# Patient Record
Sex: Female | Born: 1995 | Race: White | Hispanic: No | Marital: Single | Smoking: Former smoker
Health system: Southern US, Community
[De-identification: ages and names within clinical notes are randomized; demographics above are authoritative.]

## PROBLEM LIST (undated history)

## (undated) DIAGNOSIS — Z7289 Other problems related to lifestyle: Secondary | ICD-10-CM

## (undated) DIAGNOSIS — J45909 Unspecified asthma, uncomplicated: Secondary | ICD-10-CM

---
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
## 2016-10-25 ENCOUNTER — Emergency Department
Admission: EM | Admit: 2016-10-25 | Discharge: 2016-10-25 | Disposition: A | Discharge status: Home / Self Care | Payer: Self-pay | Attending: Emergency Medicine | Admitting: Emergency Medicine

## 2016-10-25 DIAGNOSIS — H6982 Other specified disorders of Eustachian tube, left ear: Secondary | ICD-10-CM | POA: Insufficient documentation

## 2016-10-25 DIAGNOSIS — H9202 Otalgia, left ear: Secondary | ICD-10-CM | POA: Insufficient documentation

## 2016-10-25 DIAGNOSIS — R062 Wheezing: Secondary | ICD-10-CM | POA: Insufficient documentation

## 2016-10-25 MED ORDER — ALBUTEROL SULFATE HFA 108 (90 BASE) MCG/ACT IN AERS: 2.0000 | INHALATION_SPRAY | RESPIRATORY_TRACT | 0 refills | Status: DC | PRN

## 2016-10-25 MED ORDER — METHYLPREDNISOLONE 4 MG PO TBPK: ORAL_TABLET | ORAL | 0 refills | Status: DC

## 2016-10-25 NOTE — ED Triage Notes (Signed)
Pt states has had a URI. Pt states since 2300 she has had left ear pain. Pt states has decreased hearing from left ear. Pt took  ibuprofen and benadryl pta.

## 2016-10-25 NOTE — ED Provider Notes (Signed)
[facility] Emergency Department Provider Note   ____________________________________________   First MD Initiated Contact with Patient 10/25/16 [id number]     (approximate)  I have reviewed the triage vital signs and the nursing notes.   HISTORY  Chief Complaint Otalgia    HPI Chelsea Reese is a 21 y.o. female who presents to the ED from home with a chief complaint of left earache. Patient reports she has had URI symptoms for the past week. Has been experiencing left earache since 11 PM. No associated drainage or bloody discharge. Does report muffled hearing. Denies recent air travel or trauma. Take ibuprofen and Benadryl prior to arrival with partial relief of symptoms. Denies associated fever, chills, chest pain, shortness of breath, abdominal pain, nausea, vomiting.   Past Medical History:  Diagnosis Date  . Asthma   . Deliberate self-cutting     There are no active problems to display for this patient.   No past surgical history on file.  Prior to Admission medications   Medication Sig Start Date End Date Taking? Authorizing Provider  albuterol (PROVENTIL HFA;VENTOLIN HFA) 108 (90 Base) MCG/ACT inhaler Inhale 2 puffs into the lungs every 4 (four) hours as needed for wheezing or shortness of breath. 10/25/16   Irean Hong, MD  methylPREDNISolone (MEDROL DOSEPAK) 4 MG TBPK tablet Take as directed 10/25/16   Irean Hong, MD    Allergies Patient has no known allergies.  No family history on file.  Social History Social History  Substance Use Topics  . Smoking status: Not on file  . Smokeless tobacco: Not on file  . Alcohol use Not on file  smoker  Review of Systems  Constitutional: No fever/chills. Eyes: No visual changes. ENT: positive for left earache. No sore throat. Cardiovascular: Denies chest pain. Respiratory: Denies shortness of breath. Gastrointestinal: No abdominal pain.  No nausea, no vomiting.  No diarrhea.  No  constipation. Genitourinary: Negative for dysuria. Musculoskeletal: Negative for back pain. Skin: Negative for rash. Neurological: Negative for headaches, focal weakness or numbness.   ____________________________________________   PHYSICAL EXAM:  VITAL SIGNS: ED Triage Vitals  Enc Vitals Group     BP 10/25/16 0103 126/89     Pulse Rate 10/25/16 0103 69     Resp 10/25/16 0103 16     Temp 10/25/16 0103 98.6 F (37 C)     Temp Source 10/25/16 0103 Oral     SpO2 10/25/16 0103 99 %     Weight 10/25/16 0104 120 lb (54.4 kg)     Height 10/25/16 0104  (1.626 m)     Head Circumference --      Peak Flow --      Pain Score 10/25/16 0103 6     Pain Loc --      Pain Edu? --      Excl. in GC? --     Constitutional: Alert and oriented. Well appearing and in no acute distress. Eyes: Conjunctivae are normal. PERRL. EOMI. Head: Atraumatic. Ears: Right TM within normal limits. Left TM with moderate fluid without perforation. No erythema. Nose: No congestion/rhinnorhea. Mouth/Throat: Mucous membranes are moist.  Oropharynx non-erythematous. Postnasal drip noted. Neck: No stridor.  Supple neck without meningismus. Cardiovascular: Normal rate, regular rhythm. Grossly normal heart sounds.  Good peripheral circulation. Respiratory: Normal respiratory effort.  No retractions. Lungs with scattered wheezing. Gastrointestinal: Soft and nontender. No distention. No abdominal bruits. No CVA tenderness. Musculoskeletal: No lower extremity tenderness nor edema.  No joint  effusions. Neurologic:  Normal speech and language. No gross focal neurologic deficits are appreciated. No gait instability. Skin:  Skin is warm, dry and intact. No rash noted. Psychiatric: Mood and affect are normal. Speech and behavior are normal.  ____________________________________________   LABS (all labs ordered are listed, but only abnormal results are displayed)  Labs Reviewed - No data to  display ____________________________________________  EKG  None ____________________________________________  RADIOLOGY  No results found.  ____________________________________________   PROCEDURES  Procedure(s) performed: None  Procedures  Critical Care performed: No  ____________________________________________   INITIAL IMPRESSION / ASSESSMENT AND PLAN / ED COURSE  As part of my medical decision making, I reviewed the following data within the electronic MEDICAL RECORD NUMBER Nursing notes reviewed and incorporated and Notes from prior ED visits.   21 year old female who presents with left ear pain in the setting of recent URI. Offered DuoNeb treatment for wheezing heard on exam. Patient declines; states this is normal for her. Will prescribe Medrol Dosepak which will help both eustachian tube dysfunction as well as wheezing, albuterol inhaler to use as needed, and patient will follow up closely with her PCP. Strict return precautions given. Patient and friend verbalize understanding and agree with plan of care.      ____________________________________________   FINAL CLINICAL IMPRESSION(S) / ED DIAGNOSES  Final diagnoses:  Otalgia of left ear  Dysfunction of left eustachian tube  Wheezing      NEW MEDICATIONS STARTED DURING THIS VISIT:  New Prescriptions   ALBUTEROL (PROVENTIL HFA;VENTOLIN HFA) 108 (90 BASE) MCG/ACT INHALER    Inhale 2 puffs into the lungs every 4 (four) hours as needed for wheezing or shortness of breath.   METHYLPREDNISOLONE (MEDROL DOSEPAK) 4 MG TBPK TABLET    Take as directed     Note:  This document was prepared using Dragon voice recognition software and may include unintentional dictation errors.    Irean Hong, MD 10/25/16 [phone]

## 2016-10-25 NOTE — Discharge Instructions (Signed)
1. Take Medrol Dosepak as prescribed. 2. Use albuterol inhaler 2 puffs every 4 hours as needed for wheezing. 3. Return to the ER for worsening symptoms, persistent vomiting, difficulty breathing or other concerns.

## 2017-01-09 ENCOUNTER — Encounter: Payer: Self-pay | Admitting: Emergency Medicine

## 2017-01-09 ENCOUNTER — Emergency Department
Admission: EM | Admit: 2017-01-09 | Discharge: 2017-01-10 | Disposition: A | Discharge status: Other Acute Inpatient Hospital | Payer: Self-pay | Attending: Emergency Medicine | Admitting: Emergency Medicine

## 2017-01-09 DIAGNOSIS — Y9389 Activity, other specified: Secondary | ICD-10-CM | POA: Insufficient documentation

## 2017-01-09 DIAGNOSIS — J45909 Unspecified asthma, uncomplicated: Secondary | ICD-10-CM | POA: Insufficient documentation

## 2017-01-09 DIAGNOSIS — Z23 Encounter for immunization: Secondary | ICD-10-CM | POA: Insufficient documentation

## 2017-01-09 DIAGNOSIS — R45851 Suicidal ideations: Secondary | ICD-10-CM

## 2017-01-09 DIAGNOSIS — F332 Major depressive disorder, recurrent severe without psychotic features: Secondary | ICD-10-CM | POA: Diagnosis present

## 2017-01-09 DIAGNOSIS — Y999 Unspecified external cause status: Secondary | ICD-10-CM | POA: Insufficient documentation

## 2017-01-09 DIAGNOSIS — F329 Major depressive disorder, single episode, unspecified: Secondary | ICD-10-CM | POA: Insufficient documentation

## 2017-01-09 DIAGNOSIS — S51812A Laceration without foreign body of left forearm, initial encounter: Secondary | ICD-10-CM | POA: Insufficient documentation

## 2017-01-09 DIAGNOSIS — F17200 Nicotine dependence, unspecified, uncomplicated: Secondary | ICD-10-CM | POA: Diagnosis present

## 2017-01-09 DIAGNOSIS — F17210 Nicotine dependence, cigarettes, uncomplicated: Secondary | ICD-10-CM | POA: Insufficient documentation

## 2017-01-09 DIAGNOSIS — Z915 Personal history of self-harm: Secondary | ICD-10-CM | POA: Insufficient documentation

## 2017-01-09 DIAGNOSIS — S61519A Laceration without foreign body of unspecified wrist, initial encounter: Secondary | ICD-10-CM | POA: Diagnosis present

## 2017-01-09 DIAGNOSIS — Y929 Unspecified place or not applicable: Secondary | ICD-10-CM | POA: Insufficient documentation

## 2017-01-09 DIAGNOSIS — X788XXA Intentional self-harm by other sharp object, initial encounter: Secondary | ICD-10-CM | POA: Insufficient documentation

## 2017-01-09 LAB — COMPREHENSIVE METABOLIC PANEL
ALBUMIN: 4.6 g/dL (ref 3.5–5.0)
ALT: 19 U/L (ref 14–54)
ANION GAP: 11 (ref 5–15)
AST: 24 U/L (ref 15–41)
Alkaline Phosphatase: 46 U/L (ref 38–126)
BILIRUBIN TOTAL: 1 mg/dL (ref 0.3–1.2)
BUN: 6 mg/dL (ref 6–20)
CO2: 23 mmol/L (ref 22–32)
Calcium: 8.8 mg/dL — ABNORMAL LOW (ref 8.9–10.3)
Chloride: 105 mmol/L (ref 101–111)
Creatinine, Ser: 0.58 mg/dL (ref 0.44–1.00)
GFR calc Af Amer: >60 mL/min (ref >60)
GFR calc non Af Amer: >60 mL/min (ref >60)
GLUCOSE: 98 mg/dL (ref 65–99)
POTASSIUM: 3.2 mmol/L — AB (ref 3.5–5.1)
Sodium: 139 mmol/L (ref 135–145)
TOTAL PROTEIN: 7.8 g/dL (ref 6.5–8.1)

## 2017-01-09 LAB — ETHANOL: Alcohol, Ethyl (B): 117 mg/dL — ABNORMAL HIGH (ref <10)

## 2017-01-09 LAB — URINE DRUG SCREEN, QUALITATIVE (ARMC ONLY)
AMPHETAMINES, UR SCREEN: NOT DETECTED
BENZODIAZEPINE, UR SCRN: NOT DETECTED
Barbiturates, Ur Screen: NOT DETECTED
Cannabinoid 50 Ng, Ur ~~LOC~~: POSITIVE — AB
Cocaine Metabolite,Ur ~~LOC~~: NOT DETECTED
MDMA (Ecstasy)Ur Screen: NOT DETECTED
METHADONE SCREEN, URINE: NOT DETECTED
OPIATE, UR SCREEN: NOT DETECTED
Phencyclidine (PCP) Ur S: NOT DETECTED
Tricyclic, Ur Screen: NOT DETECTED

## 2017-01-09 LAB — CBC
HEMATOCRIT: 37.8 % (ref 35.0–47.0)
Hemoglobin: 12.9 g/dL (ref 12.0–16.0)
MCH: 30.1 pg (ref 26.0–34.0)
MCHC: 34.1 g/dL (ref 32.0–36.0)
MCV: 88.5 fL (ref 80.0–100.0)
Platelets: 338 10*3/uL (ref 150–440)
RBC: 4.27 MIL/uL (ref 3.80–5.20)
RDW: 12.6 % (ref 11.5–14.5)
WBC: 10.1 10*3/uL (ref 3.6–11.0)

## 2017-01-09 LAB — PREGNANCY, URINE: PREG TEST UR: NEGATIVE

## 2017-01-09 LAB — SALICYLATE LEVEL: Salicylate Lvl: <7 mg/dL (ref 2.8–30.0)

## 2017-01-09 LAB — ACETAMINOPHEN LEVEL

## 2017-01-09 MED ORDER — TETANUS-DIPHTH-ACELL PERTUSSIS 5-2.5-18.5 LF-MCG/0.5 IM SUSP
0.5000 mL | Freq: Once | INTRAMUSCULAR | Status: AC
  Administered 2017-01-10: 0.5 mL via INTRAMUSCULAR
  Filled 2017-01-09: qty 0.5

## 2017-01-09 NOTE — ED Notes (Signed)
Pt presents post self inflicted laceration with razor blade to left forearm. Pt states she wants to die.

## 2017-01-09 NOTE — ED Notes (Signed)
Pt handed off to Lillingtonashley, rn and taken to room 22 for triage and treatment.

## 2017-01-09 NOTE — ED Provider Notes (Signed)
[facility] Emergency Department Provider Note ____________________________________________   First MD Initiated Contact with Patient 01/09/17 2327     (approximate)  I have reviewed the triage vital signs and the nursing notes.   HISTORY  Chief Complaint Suicidal    HPI Lemoyne Karie Schwalbe Ayotte is a 21 y.o. female with remote prior history of self cutting and asthma who presents with lacerations to the left wrist and forearm, acute onset, self-inflicted, and occurring after patient had a disagreement with her significant other who left her.  Patient states that she did not know how to handle her emotions and caught herself as a result.  Patient states that she realizes it was stupid, and has no further intention of hurting herself and no thoughts of suicide or hurting anyone else.  Patient states that multiple years ago she cut herself, and she also has intermittently taken her mother's Zoloft but is not prescribed for an antidepressant herself and is not seen a therapist in years.  She denies any other acute medical issues.  Past Medical History:  Diagnosis Date  . Asthma   . Deliberate self-cutting     There are no active problems to display for this patient.   History reviewed. No pertinent surgical history.  Prior to Admission medications   Medication Sig Start Date End Date Taking? Authorizing Provider  albuterol (PROVENTIL HFA;VENTOLIN HFA) 108 (90 Base) MCG/ACT inhaler Inhale 2 puffs into the lungs every 4 (four) hours as needed for wheezing or shortness of breath. Patient not taking: Reported on 01/10/2017 10/25/16   Irean Hong, MD  methylPREDNISolone (MEDROL DOSEPAK) 4 MG TBPK tablet Take as directed Patient not taking: Reported on 01/10/2017 10/25/16   Irean Hong, MD    Allergies Patient has no known allergies.  No family history on file.  Social History Social History   Tobacco Use  . Smoking status: Current Some Day Smoker    Types:  Cigarettes  . Smokeless tobacco: Never Used  Substance Use Topics  . Alcohol use: Yes    Comment: Pt reports half bottle of liquor every other day  . Drug use: Yes    Types: Marijuana    Review of Systems  Constitutional: No fever. Eyes: No redness. ENT: No sore throat. Cardiovascular: Denies chest pain. Respiratory: Denies shortness of breath. Gastrointestinal: No nausea, no vomiting.  Genitourinary: Negative for flank pain.  Musculoskeletal: Negative for back pain. Skin: Positive for lacerations. Neurological: Negative for headache.   ____________________________________________   PHYSICAL EXAM:  VITAL SIGNS: ED Triage Vitals  Enc Vitals Group     BP --      Pulse Rate 01/09/17 2245 76     Resp 01/09/17 2245 19     Temp 01/09/17 2245 98.8 F (37.1 C)     Temp Source 01/09/17 2245 Oral     SpO2 01/09/17 2245 99 %     Weight 01/09/17 2311 120 lb (54.4 kg)     Height 01/09/17 2311 5\' 3"  (1.6 m)     Head Circumference --      Peak Flow --      Pain Score --      Pain Loc --      Pain Edu? --      Excl. in GC? --     Constitutional: Alert and oriented. Well appearing and in no acute distress. Eyes: Conjunctivae are normal.  Head: Atraumatic. Nose: No congestion/rhinnorhea. Mouth/Throat: Mucous membranes are moist.   Neck: Normal range  of motion.  Cardiovascular:  Good peripheral circulation. Respiratory: Normal respiratory effort.   Gastrointestinal: No distention.  Musculoskeletal:   Extremities warm and well perfused.  Neurologic:  Normal speech and language. No gross focal neurologic deficits are appreciated.  Skin:  Skin is warm and dry. No rash noted. Multiple superficial lacerations to left forearm ranging from 2-5 cm in length. Psychiatric: Mood and affect are normal. Speech and behavior are normal.  ____________________________________________   LABS (all labs ordered are listed, but only abnormal results are displayed)  Labs Reviewed    COMPREHENSIVE METABOLIC PANEL - Abnormal; Notable for the following components:      Result Value   Potassium 3.2 (*)    Calcium 8.8 (*)    All other components within normal limits  ETHANOL - Abnormal; Notable for the following components:   Alcohol, Ethyl (B) 117 (*)    All other components within normal limits  ACETAMINOPHEN LEVEL - Abnormal; Notable for the following components:   Acetaminophen (Tylenol), Serum <10 (*)    All other components within normal limits  URINE DRUG SCREEN, QUALITATIVE ([facility] ONLY) - Abnormal; Notable for the following components:   Cannabinoid 50 Ng, Ur [place] POSITIVE (*)    All other components within normal limits  SALICYLATE LEVEL  CBC  PREGNANCY, URINE   ____________________________________________  EKG   ____________________________________________  RADIOLOGY    ____________________________________________   PROCEDURES  Procedure(s) performed: Yes  LACERATION REPAIR Performed by: Dionne BucySebastian Shemica Meath Authorized by: Dionne BucySebastian Jackey Housey Consent: Verbal consent obtained. Risks and benefits: risks, benefits and alternatives were discussed Consent given by: patient Patient identity confirmed: provided demographic data Prepped and Draped in normal sterile fashion Wound explored  Laceration Location: Left forearm  Laceration Length: Total cumulative length approximately 30 cm  No Foreign Bodies seen or palpated  Anesthesia: local infiltration  Local anesthetic: lidocaine 1% % w/o epinephrine  Anesthetic total: 15 ml  Irrigation method: syringe Amount of cleaning: standard  Skin closure: 4-0 Ethilon  Number of sutures: 18 total sutures across the multiple lacerations to the left forearm.  Technique: Simple interrupted  Patient tolerance: Patient tolerated the procedure well with no immediate complications.    Critical Care performed: No ____________________________________________   INITIAL IMPRESSION / ASSESSMENT AND  PLAN / ED COURSE  Pertinent labs & imaging results that were available during my care of the patient were reviewed by me and considered in my medical decision making (see chart for details).  21 year old female with past history of self cutting behavior presents with an episode of cutting to her left forearm after her significant other left her and they had a verbal altercation.  Patient states that she drank 3 shots of liquor, but denies drug use.  Review of past medical records in epic is noncontributory.  On exam, the patient has multiple superficial lacerations of which will need suture repair to the left forearm.  No other injuries.  At this time, patient is here voluntarily.  She denies any further intention to harm herself, and says that she thinks it was stupid.  She denies any SI or HI or any other acute psychiatric symptoms.  We will have SOC evaluation and patient agrees to stay.  I will not place patient under involuntary commitment at this time as there is no evidence of acute danger to self or others.    ----------------------------------------- 4:10 AM on 01/10/2017 -----------------------------------------  The patient was evaluated by [facility], and the provider recommends inpatient admission.  The patient continues to be  voluntary.  She is medically cleared[facility] and [facility]  Lacerations were repaired.  ____________________________________________   FINAL CLINICAL IMPRESSION(S) / ED DIAGNOSES  Final diagnoses:  Suicidal ideations      NEW MEDICATIONS STARTED DURING THIS VISIT:  This SmartLink is deprecated. Use AVSMEDLIST instead to display the medication list for a patient.   Note:  This document was prepared using Dragon voice recognition software and may include unintentional dictation errors.    Dionne BucySiadecki, Rickelle Sylvestre, MD 01/10/17 [phone]

## 2017-01-09 NOTE — ED Notes (Signed)
Pt's significant other states she has pt's car and car is at sister's house, [facility] [employer] at [phone].

## 2017-01-09 NOTE — ED Triage Notes (Signed)
Pt in via POV; pt reports, "my girlfriend of 4 years found somebody else and I just didn't know how to handle my emotions at this time."  Pt reports cutting herself with razor blade; pt with multiple laceration to left forearm, bleeding controlled at this time, dry bandage placed. Pt tearful during triage, states, "I just want to go home."

## 2017-01-10 ENCOUNTER — Encounter: Payer: Self-pay | Admitting: Psychiatry

## 2017-01-10 ENCOUNTER — Inpatient Hospital Stay
Admission: AD | Admit: 2017-01-10 | Discharge: 2017-01-12 | DRG: 885 | Disposition: A | Discharge status: Home / Self Care | Payer: No Typology Code available for payment source | Source: Intra-hospital | Attending: Psychiatry | Admitting: Psychiatry

## 2017-01-10 ENCOUNTER — Other Ambulatory Visit: Payer: Self-pay

## 2017-01-10 DIAGNOSIS — F17200 Nicotine dependence, unspecified, uncomplicated: Secondary | ICD-10-CM | POA: Diagnosis present

## 2017-01-10 DIAGNOSIS — F332 Major depressive disorder, recurrent severe without psychotic features: Secondary | ICD-10-CM | POA: Diagnosis present

## 2017-01-10 DIAGNOSIS — F1010 Alcohol abuse, uncomplicated: Secondary | ICD-10-CM | POA: Diagnosis present

## 2017-01-10 DIAGNOSIS — Z915 Personal history of self-harm: Secondary | ICD-10-CM | POA: Diagnosis not present

## 2017-01-10 DIAGNOSIS — J45909 Unspecified asthma, uncomplicated: Secondary | ICD-10-CM | POA: Diagnosis present

## 2017-01-10 DIAGNOSIS — Z818 Family history of other mental and behavioral disorders: Secondary | ICD-10-CM | POA: Diagnosis not present

## 2017-01-10 DIAGNOSIS — G4700 Insomnia, unspecified: Secondary | ICD-10-CM | POA: Diagnosis present

## 2017-01-10 DIAGNOSIS — F17210 Nicotine dependence, cigarettes, uncomplicated: Secondary | ICD-10-CM | POA: Diagnosis present

## 2017-01-10 DIAGNOSIS — F1220 Cannabis dependence, uncomplicated: Secondary | ICD-10-CM | POA: Diagnosis present

## 2017-01-10 DIAGNOSIS — X789XXA Intentional self-harm by unspecified sharp object, initial encounter: Secondary | ICD-10-CM | POA: Diagnosis present

## 2017-01-10 DIAGNOSIS — S61519A Laceration without foreign body of unspecified wrist, initial encounter: Secondary | ICD-10-CM | POA: Diagnosis present

## 2017-01-10 DIAGNOSIS — Z79899 Other long term (current) drug therapy: Secondary | ICD-10-CM

## 2017-01-10 DIAGNOSIS — R45851 Suicidal ideations: Secondary | ICD-10-CM | POA: Diagnosis present

## 2017-01-10 MED ORDER — HYDROXYZINE HCL 25 MG PO TABS
25.0000 mg | ORAL_TABLET | Freq: Three times a day (TID) | ORAL | Status: DC | PRN
Start: 2017-01-10 — End: 2017-01-12
  Administered 2017-01-11: 25 mg via ORAL
  Filled 2017-01-10: qty 1

## 2017-01-10 MED ORDER — TRAZODONE HCL 100 MG PO TABS
100.0000 mg | ORAL_TABLET | Freq: Every day | ORAL | Status: DC
  Administered 2017-01-11: 100 mg via ORAL
  Filled 2017-01-10: qty 1

## 2017-01-10 MED ORDER — ACETAMINOPHEN 325 MG PO TABS: 650.0000 mg | ORAL_TABLET | Freq: Four times a day (QID) | ORAL | Status: DC | PRN

## 2017-01-10 MED ORDER — ALUM & MAG HYDROXIDE-SIMETH 200-200-20 MG/5ML PO SUSP: 30.0000 mL | ORAL | Status: DC | PRN

## 2017-01-10 MED ORDER — MAGNESIUM HYDROXIDE 400 MG/5ML PO SUSP: 30.0000 mL | Freq: Every day | ORAL | Status: DC | PRN

## 2017-01-10 MED ORDER — LORAZEPAM 1 MG PO TABS
1.0000 mg | ORAL_TABLET | Freq: Four times a day (QID) | ORAL | Status: DC | PRN
  Administered 2017-01-10 (×2): 1 mg via ORAL
  Filled 2017-01-10 (×2): qty 1

## 2017-01-10 MED ORDER — DOUBLE ANTIBIOTIC 500-10000 UNIT/GM EX OINT
TOPICAL_OINTMENT | Freq: Every day | CUTANEOUS | Status: DC
  Administered 2017-01-11 – 2017-01-12 (×2): via TOPICAL
  Filled 2017-01-10: qty 28.4

## 2017-01-10 MED ORDER — ALBUTEROL SULFATE HFA 108 (90 BASE) MCG/ACT IN AERS
2.0000 | INHALATION_SPRAY | RESPIRATORY_TRACT | Status: DC | PRN
  Filled 2017-01-10: qty 6.7

## 2017-01-10 MED ORDER — GABAPENTIN 100 MG PO CAPS
100.0000 mg | ORAL_CAPSULE | Freq: Three times a day (TID) | ORAL | Status: DC
  Administered 2017-01-11 – 2017-01-12 (×3): 100 mg via ORAL
  Filled 2017-01-10 (×4): qty 1

## 2017-01-10 MED ORDER — NICOTINE 21 MG/24HR TD PT24
21.0000 mg | MEDICATED_PATCH | Freq: Every day | TRANSDERMAL | Status: DC
  Administered 2017-01-11: 21 mg via TRANSDERMAL
  Filled 2017-01-10 (×2): qty 1

## 2017-01-10 MED ORDER — DOUBLE ANTIBIOTIC 500-10000 UNIT/GM EX OINT
TOPICAL_OINTMENT | Freq: Every day | CUTANEOUS | Status: DC
  Filled 2017-01-10 (×5): qty 1

## 2017-01-10 NOTE — BH Assessment (Signed)
Assessment Note  Chelsea Reese is an 21 y.o. female, with a history of cutting, presenting to the ED  with self-inflicted lacerations to the  wrist and forearm, after patient had a disagreement with her significant other who left her. Pt required multiple stitches to her forearm.  Pt reports becoming emotional after her girlfriend left her and says she did not know what else to do.  She currently denies SI/HI while in the ED and states that she feels embarrassed for cutting herself.  Patient admits to drinking alcohol and smoking cannabis.  BAC was 117 and UDS positive for cannabis.  Pt denies any previous psychiatric hospitalizations or mental health treatment.    Patient was evaluated by psychiatry and recommended for inpatient hospitalization for stabilization.    Diagnosis: Major Depressive Disorder  Past Medical History:  Past Medical History:  Diagnosis Date  . Asthma   . Deliberate self-cutting     History reviewed. No pertinent surgical history.  Family History: No family history on file.  Social History:  reports that she has been smoking cigarettes.  she has never used smokeless tobacco. She reports that she drinks alcohol. She reports that she uses drugs. Drug: Marijuana.  Additional Social History:  Alcohol / Drug Use Pain Medications: See PTA Prescriptions: See PTA Over the Counter: See PTA History of alcohol / drug use?: Yes Negative Consequences of Use: Personal relationships Substance #1 Name of Substance 1: ETOH Substance #2 Name of Substance 2: Cannabis  CIWA: CIWA-Ar Pulse Rate: 76 COWS:    Allergies: No Known Allergies  Home Medications:  (Not in a hospital admission)  OB/GYN Status:  Patient's last menstrual period was 01/09/2017 (approximate).  General Assessment Data [facility] Assessment: In system Is this a Tele or Face-to-Face Assessment?: Face-to-Face Is this an Initial Assessment or a Re-assessment for this  encounter?: Initial Assessment Marital status: Single Maiden name: na Is patient pregnant?: No Pregnancy Status: No Living Arrangements: Parent Can pt return to current living arrangement?: Yes Admission Status: Voluntary Is patient capable of signing voluntary admission?: Yes Referral Source: Self/Family/Friend Insurance type: None     Crisis Care Plan Living Arrangements: Parent Legal Guardian: Other:(self) Name of Psychiatrist: none reported Name of Therapist: none reported  Education Status Is patient currently in school?: No Current Grade: na Highest grade of school patient has completed: hs Name of school: na Contact person: na  Risk to self with the past 6 months Suicidal Ideation: Yes-Currently Present Has patient been a risk to self within the past 6 months prior to admission? : No Suicidal Intent: Yes-Currently Present Has patient had any suicidal intent within the past 6 months prior to admission? : No Is patient at risk for suicide?: Yes Suicidal Plan?: Yes-Currently Present Has patient had any suicidal plan within the past 6 months prior to admission? : No Specify Current Suicidal Plan: Pt cut her wrists with razor Access to Means: Yes Specify Access to Suicidal Means: Pt has access to razor What has been your use of drugs/alcohol within the last 12 months?: ETOH/cannabis Previous Attempts/Gestures: Yes How many times?: 5 Other Self Harm Risks: none identified Triggers for Past Attempts: Other personal contacts Intentional Self Injurious Behavior: Cutting Comment - Self Injurious Behavior: Pt has hx of superficial cuts to forearm Family Suicide History: No Recent stressful life event(s): Conflict (Comment)(relationship issues) Persecutory voices/beliefs?: No Depression: Yes Depression Symptoms: Loss of interest in usual pleasures, Feeling worthless/self pity, Tearfulness Substance abuse history and/or treatment  for substance abuse?: Yes Suicide  prevention information given to non-admitted patients: Not applicable  Risk to Others within the past 6 months Homicidal Ideation: No Does patient have any lifetime risk of violence toward others beyond the six months prior to admission? : No Thoughts of Harm to Others: No Current Homicidal Intent: No Current Homicidal Plan: No Access to Homicidal Means: No History of harm to others?: No Assessment of Violence: None Noted Does patient have access to weapons?: No Criminal Charges Pending?: No Does patient have a court date: No Is patient on probation?: No  Psychosis Hallucinations: None noted Delusions: None noted  Mental Status Report Appearance/Hygiene: In scrubs Eye Contact: Fair Motor Activity: Freedom of movement Speech: Logical/coherent Level of Consciousness: Crying Mood: Depressed, Sad Affect: Appropriate to circumstance, Sad, Depressed Anxiety Level: Minimal Thought Processes: Relevant Judgement: Impaired Orientation: Person, Place, Time, Situation Obsessive Compulsive Thoughts/Behaviors: None  Cognitive Functioning Concentration: Normal Memory: Recent Intact, Remote Intact IQ: Average Insight: Poor Impulse Control: Poor Appetite: Fair Sleep: Decreased Total Hours of Sleep: 5 Vegetative Symptoms: None  ADLScreening [facility] Assessment Services) Patient's cognitive ability adequate to safely complete daily activities?: Yes Patient able to express need for assistance with ADLs?: Yes Independently performs ADLs?: Yes (appropriate for developmental age)  Prior Inpatient Therapy Prior Inpatient Therapy: No Prior Therapy Dates: na Prior Therapy Facilty/Provider(s): na Reason for Treatment: na  Prior Outpatient Therapy Prior Outpatient Therapy: No Prior Therapy Dates: na Prior Therapy Facilty/Provider(s): na Reason for Treatment: na Does patient have an ACCT team?: No Does patient have Intensive In-House Services?  : No Does patient have [facility] services? :  No Does patient have P4CC services?: No  ADL Screening (condition at time of admission) Patient's cognitive ability adequate to safely complete daily activities?: Yes Patient able to express need for assistance with ADLs?: Yes Independently performs ADLs?: Yes (appropriate for developmental age)       Abuse/Neglect Assessment (Assessment to be complete while patient is alone) Abuse/Neglect Assessment Can Be Completed: Yes Physical Abuse: Denies Verbal Abuse: Denies Sexual Abuse: Denies Exploitation of patient/patient's resources: Denies Self-Neglect: Denies Values / Beliefs Cultural Requests During Hospitalization: None Spiritual Requests During Hospitalization: None Consults Spiritual Care Consult Needed: No Social Work Consult Needed: No Merchant navy officerAdvance Directives (For Healthcare) Does Patient Have a Medical Advance Directive?: No Would patient like information on creating a medical advance directive?: No - Patient declined    Additional Information 1:1 In Past 12 Months?: No CIRT Risk: No Elopement Risk: No Does patient have medical clearance?: Yes     Disposition:  Disposition Initial Assessment Completed for this Encounter: Yes Disposition of Patient: Inpatient treatment program  On Site Evaluation by:   Reviewed with Physician:    Artist Beachoxana C Aparna Vanderweele 01/10/2017 6:41 AM

## 2017-01-10 NOTE — ED Notes (Signed)

## 2017-01-10 NOTE — Tx Team (Signed)
Initial Treatment Plan 01/10/2017 11:49 PM Chelsea Reese [id number]    PATIENT STRESSORS: Marital or family conflict Substance abuse Traumatic event   PATIENT STRENGTHS: Ability for insight Active sense of humor Average or above average intelligence Capable of independent living Communication skills Financial means General fund of knowledge Motivation for treatment/growth Physical Health Special hobby/interest Supportive family/friends Work skills   PATIENT IDENTIFIED PROBLEMS: Depression 01-10-17  Anxiety 01-10-17  Substance Abuse 01-10-17                 DISCHARGE CRITERIA:  Ability to meet basic life and health needs Adequate post-discharge living arrangements Improved stabilization in mood, thinking, and/or behavior Need for constant or close observation no longer present Reduction of life-threatening or endangering symptoms to within safe limits Safe-care adequate arrangements made Verbal commitment to aftercare and medication compliance  PRELIMINARY DISCHARGE PLAN: Outpatient therapy Participate in family therapy Return to previous living arrangement Return to previous work or school arrangements  PATIENT/FAMILY INVOLVEMENT: This treatment plan has been presented to and reviewed with the patient, Chelsea Reese.  The patient has been given the opportunity to ask questions and make suggestions.  Lenox Pondserry J Tanika Bracco, RN 01/10/2017, 11:49 PM

## 2017-01-10 NOTE — ED Notes (Signed)
Report called to Ceasar MonsMargaret RN, pt moved to ED BHU 2 pending admission per MD orders.

## 2017-01-10 NOTE — ED Notes (Signed)
SOC set up at bedside. 

## 2017-01-10 NOTE — ED Notes (Signed)

## 2017-01-10 NOTE — ED Notes (Signed)
A telephone call was received from patient's mother Chelsea Reese.

## 2017-01-10 NOTE — Progress Notes (Signed)
Pt presents with flat affect and depressed mood this shift. Observed to be tearful at intervals during shift. Endorsed anxiety 8/10 when assessed. Per pt "I feel stupid doing this, I have not cut in about 6 years and now I did this because I was drunk". Denies SI, HI, AVH and pain. Pt states she takes Zoloft 100 mg PO daily at home. When asked who her provider was and when medication was ordered, pt stated "I get it from my mom, she's been treating me for 6 months, it helped and she's the only one who can help me". PRN Ativan given as per order for anxiety and pt verbalized relief when reassessed. Tolerates all PO intake well. Emotional support and availability provided. Pt informed of bed assignment on BMU and is in agreement. Q 15 minutes safety checks maintained without self harm gestures or outburst to report at this time.

## 2017-01-10 NOTE — ED Notes (Signed)
Report was received Chelsea D., RN; Pt. Verbalizes  complaints of having Depression with self cutting; and distress of current relationship; verbalizes having S.I.; and denies having Hi. Continue to monitor with 15 min. Monitoring.

## 2017-01-10 NOTE — BH Assessment (Signed)
[facility] by Dr. Jennet MaduroPucilowska.  Attending Physician will be Dr. Jennet MaduroPucilowska.   Patient has been assigned to room 316A, [facility] Gwen.   Intake Paper Work has been signed and placed on patient chart.  ER staff is aware of the admission [facility]( [facility].; Dr. Roxan Hockeyobinson, ER MD; Amy Patient's Nurse & Marylene LandAngela Patient Access).

## 2017-01-10 NOTE — ED Notes (Signed)
Report was received from Lissa Hoardlivette W., RN; Pt. Verbalizes  complaints of having Depression; with distress of relationship; verbalizes having S.I.; denies having Hi. Continue to monitor with 15 min. Monitoring.

## 2017-01-11 DIAGNOSIS — F1220 Cannabis dependence, uncomplicated: Secondary | ICD-10-CM | POA: Diagnosis present

## 2017-01-11 DIAGNOSIS — F332 Major depressive disorder, recurrent severe without psychotic features: Principal | ICD-10-CM

## 2017-01-11 MED ORDER — SERTRALINE HCL 50 MG PO TABS: 150.0000 mg | ORAL_TABLET | Freq: Every day | ORAL | 1 refills | Status: AC

## 2017-01-11 MED ORDER — SERTRALINE HCL 25 MG PO TABS
150.0000 mg | ORAL_TABLET | Freq: Every day | ORAL | Status: DC
  Administered 2017-01-11 – 2017-01-12 (×2): 150 mg via ORAL
  Filled 2017-01-11 (×2): qty 2

## 2017-01-11 NOTE — Plan of Care (Signed)
Pt verbally contracts for safety. Pt denies SI/HI. Pt verbalizes education provided upon admissions.

## 2017-01-11 NOTE — Progress Notes (Signed)
D: [facility]. Patient [facility] and Claris CheMargaret, RN, skin is intact and clear, but pt does have left FA lacerations from cutting herself that are stiched. Pt searched and no contraband found and prohibited unit items locked up and secured for discharge. Pt. Was admitted under the services of Dr. Jennet MaduroPucilowska.    A: Patient oriented to unit/room/call light.Patient was encourage to participate in unit activities and continue with plan of care. Q x 15 minute observation checks were completed for safety. Pt verbally contracts for safety and denies SI/HI.   R: Patient is receptive to treatment and safety maintained on unit. Pt compliant with the admissions process.
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
## 2017-01-11 NOTE — Plan of Care (Signed)
Pt. Verbalizes understanding of provided education. Pt reports feeling, "better" today then yesterday. Pt reports sleeping, "good" and eating, "good". Pt denies SI/HI and verbally contracts for safety. Pt states she can remain safe while on the unit.
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
## 2017-01-11 NOTE — Plan of Care (Signed)
  Progressing Education: Mental status will improve 01/11/2017 1806 - Progressing by Weldon PickingJones, Dorman Calderwood E, RN Verbalization of understanding the information provided will improve 01/11/2017 1806 - Progressing by Weldon PickingJones, Amberley Hamler E, RN Safety: Periods of time without injury will increase 01/11/2017 1806 - Progressing by Weldon PickingJones, Zuri Bradway E, RN Self-Concept: Ability to disclose and discuss suicidal ideas will improve 01/11/2017 1806 - Progressing by Weldon PickingJones, Yaqueline Gutter E, RN Activity: Interest or engagement in leisure activities will improve 01/11/2017 1806 - Progressing by Weldon PickingJones, Vernita Tague E, RN Imbalance in normal sleep/wake cycle will improve 01/11/2017 1806 - Progressing by Weldon PickingJones, Nusaybah Ivie E, RN

## 2017-01-11 NOTE — B[facility] Group Notes (Signed)
B[facility] Group Notes:  (Nursing/MHT/Case Management/Adjunct)  Date:  01/11/2017  Time:  9:30 PM  Type of Therapy:  Group Therapy  Participation Level:  Active  Participation Quality:  Appropriate  Affect:  Appropriate  Cognitive:  Appropriate  Insight:  Appropriate  Engagement in Group:  Engaged  Modes of Intervention:  Discussion  Summary of Progress/Problems:  Burt EkJanice Marie Rosaland Shiffman 01/11/2017, 9:30 PM

## 2017-01-11 NOTE — Progress Notes (Addendum)
D:Pt denies SI/HI/AVH. Pt is pleasant and cooperative. Pt. Has no Complaints.  Patient is appropriate and engaging. Pt reports feeling, "better" today then yesterday. Pt reports sleeping, "good" and eating, "good". Verbally contracts for safety. Pt states she can remain safe while on the unit.   A: Q x 15 minute observation checks were completed for safety. Patient was provided with education. Pt. Verbalizes understanding of provided education. Patient was given scheduled medications. Patient  was encourage to attend groups, participate in unit activities and continue with plan of care.   R:Patient is complaint with medication and unit procedures. Pt attends groups.             Patient slept for Estimated Hours of 8; Precautionary checks every 15 minutes for safety maintained, room free of safety hazards, patient sustains no injury or falls during this shift.

## 2017-01-11 NOTE — Progress Notes (Signed)
D:Pt denies SI/HI/AVH. Pt is pleasant and cooperative. Pt has no reported complaints.  Patient Interaction is engaging and productive. Pt verbally contracts for safety.   A: Q x 15 minute observation checks were completed for safety. Patient was provided with education. Patient  was encourage to attend groups, participate in unit activities and continue with plan of care.   R:Patient is complaint with unit procedures and admissions, but stated she did not need her night time sleep aid to fall asleep. Will continue to encourage compliance.             Patient slept for Estimated Hours of 4.45; Precautionary checks every 15 minutes for safety maintained, room free of safety hazards, patient sustains no injury or falls during this shift.

## 2017-01-11 NOTE — H&P (Addendum)
Psychiatric Admission Assessment Adult  Patient Identification: Chelsea Reese MRN:  [id number] Date of Evaluation:  01/11/2017 Chief Complaint:  Major depressive disorder Principal Diagnosis: Major depressive disorder, recurrent severe without psychotic features ([place]) Diagnosis:   Patient Active Problem List   Diagnosis Date Noted  . Major depressive disorder, recurrent severe without psychotic features ([place]) [F33.2] 01/10/2017    Priority: High  . Cannabis use disorder, moderate, dependence ([place]) [F12.20] 01/11/2017  . Suicidal ideation [R45.851] 01/10/2017  . Self-inflicted laceration of wrist [S61.519A] 01/10/2017  . Tobacco use disorder [F17.200] 01/10/2017   History of Present Illness:   Identifying data. [name] is a 21 year old female with a history of depression and self injurious behavior.  Chief complaint. "I don't know why I did it."  History of present illness. Information was obtained from the patient and the chart. The patient was brought to the ER after cutting her forearm with a razor so deeply that it required stiches. She was upset that day as she was chased in a car bey her ex-girlfriend's mother. She was sad and upset. She bought a bottle of vodka and started drinking. To make things worse, her mother who is very supportive was out of town. She was listening to sad music, wrote suicide note and cut herself. There is a long history of depression and anxiety at least since the age of 31. She used to cut her legs superficially but has not done it in 6 years. Reports sadness, poor concentration, social isolation crying spells. She is not chronically suicidal and cut herself impulsively. There are no psychotic symptoms. She reports severe social anxiety and OCD when young but not lately. She does not believe she has a drinking problem. She does not use drugs.  Past psychiatric history. Long standing depression and anxiety worse in middle school. She came out in high  school she started cutting and had therapy for it. No suicide attempts or hospitalizations. She has been taking Zoloft 100-150 mg daily for the past 6 months with improvement. Zoloft has been prescribed for her mother.  Family psychiatric history. Mother with depression.   Social history. 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 She just broke up with her girlfriend of 4 years. She lives with her parents and her brother.   Total Time spent with patient: 1 hour  Is the patient at risk to self? No.  Has the patient been a risk to self in the past 6 months? No.  Has the patient been a risk to self within the distant past? No.  Is the patient a risk to others? No.  Has the patient been a risk to others in the past 6 months? No.  Has the patient been a risk to others within the distant past? No.   Prior Inpatient Therapy:   Prior Outpatient Therapy:    Alcohol Screening: 1. How often do you have a drink containing alcohol?: 2 to 4 times a month 2. How many drinks containing alcohol do you have on a typical day when you are drinking?: 1 or 2 3. How often do you have six or more drinks on one occasion?: Less than monthly AUDIT-C Score: 3 4. How often during the last year have you found that you were not able to stop drinking once you had started?: Never 5. How often during the last year have you failed to do what was normally expected from you becasue of  drinking?: Less than monthly 6. How often during the last year have you needed a first drink in the morning to get yourself going after a heavy drinking session?: Never 7. How often during the last year have you had a feeling of guilt of remorse after drinking?: Never 8. How often during the last year have you been unable to remember what happened the night before because you had been drinking?: Less than monthly 9. Have you or someone else been injured as a result of your drinking?: No 10.  Has a relative or friend or a doctor or another health worker been concerned about your drinking or suggested you cut down?: Yes, but not in the last year Alcohol Use Disorder Identification Test Final Score (AUDIT): 7 Intervention/Follow-up: Alcohol Education, Brief Advice Substance Abuse History in the last 12 months:  Yes.   Consequences of Substance Abuse: Negative Previous Psychotropic Medications: Yes  Psychological Evaluations: No  Past Medical History:  Past Medical History:  Diagnosis Date  . Asthma   . Deliberate self-cutting    History reviewed. No pertinent surgical history. Family History: History reviewed. No pertinent family history.  Tobacco Screening: Have you used any form of tobacco in the last 30 days? (Cigarettes, Smokeless Tobacco, Cigars, and/or Pipes): Yes Tobacco use, Select all that apply: 4 or less cigarettes per day Are you interested in Tobacco Cessation Medications?: Yes, will notify MD for an order Counseled patient on smoking cessation including recognizing danger situations, developing coping skills and basic information about quitting provided: Yes Social History:  Social History   Substance and Sexual Activity  Alcohol Use Yes   Comment: Pt reports half bottle of liquor every other day     Social History   Substance and Sexual Activity  Drug Use Yes  . Types: Marijuana    Additional Social History:                           Allergies:  No Known Allergies Lab Results:  Results for orders placed or performed during the hospital encounter of 01/09/17 (from the past 48 hour(s))  Urine Drug Screen, Qualitative     Status: Abnormal   Collection Time: 01/09/17 11:12 PM  Result Value Ref Range   Tricyclic, Ur Screen NONE DETECTED NONE DETECTED   Amphetamines, Ur Screen NONE DETECTED NONE DETECTED   MDMA (Ecstasy)Ur Screen NONE DETECTED NONE DETECTED   Cocaine Metabolite,Ur [place] NONE DETECTED NONE DETECTED   Opiate, Ur Screen NONE  DETECTED NONE DETECTED   Phencyclidine (PCP) Ur S NONE DETECTED NONE DETECTED   Cannabinoid 50 Ng, Ur [place] POSITIVE (A) NONE DETECTED   Barbiturates, Ur Screen NONE DETECTED NONE DETECTED   Benzodiazepine, Ur Scrn NONE DETECTED NONE DETECTED   Methadone Scn, Ur NONE DETECTED NONE DETECTED    Comment: (NOTE) Tricyclics + metabolites, urine    Cutoff 1000 ng/mL Amphetamines + metabolites, urine  Cutoff 1000 ng/mL MDMA (Ecstasy), urine              Cutoff 500 ng/mL Cocaine Metabolite, urine          Cutoff 300 ng/mL Opiate + metabolites, urine        Cutoff 300 ng/mL Phencyclidine (PCP), urine         Cutoff 25 ng/mL Cannabinoid, urine                 Cutoff 50 ng/mL Barbiturates + metabolites, urine  Cutoff 200 ng/mL Benzodiazepine, urine  Cutoff 200 ng/mL Methadone, urine                   Cutoff 300 ng/mL The urine drug screen provides only a preliminary, unconfirmed analytical test result and should not be used for non-medical purposes. Clinical consideration and professional judgment should be applied to any positive drug screen result due to possible interfering substances. A more specific alternate chemical method must be used in order to obtain a confirmed analytical result. Gas chromatography / mass spectrometry (GC/MS) is the preferred confirmat ory method. Performed at [facility], [address] [place], [place] [address]   Pregnancy, urine     Status: None   Collection Time: 01/09/17 11:12 PM  Result Value Ref Range   Preg Test, Ur NEGATIVE NEGATIVE    Comment: Performed at [facility], [address] [place], [place] [address]  Comprehensive metabolic panel     Status: Abnormal   Collection Time: 01/09/17 11:22 PM  Result Value Ref Range   Sodium 139 135 - 145 mmol/L   Potassium 3.2 (L) 3.5 - 5.1 mmol/L   Chloride 105 101 - 111 mmol/L   CO2 23 22 - 32 mmol/L   Glucose, Bld 98 65 - 99 mg/dL   BUN 6 6 - 20 mg/dL   Creatinine, Ser  0.58 0.44 - 1.00 mg/dL   Calcium 8.8 (L) 8.9 - 10.3 mg/dL   Total Protein 7.8 6.5 - 8.1 g/dL   Albumin 4.6 3.5 - 5.0 g/dL   AST 24 15 - 41 U/L   ALT 19 14 - 54 U/L   Alkaline Phosphatase 46 38 - 126 U/L   Total Bilirubin 1.0 0.3 - 1.2 mg/dL   GFR calc non Af Amer >60 >60 mL/min   GFR calc Af Amer >60 >60 mL/min    Comment: (NOTE) The eGFR has been calculated using the CKD EPI equation. This calculation has not been validated in all clinical situations. eGFR's persistently <60 mL/min signify possible Chronic Kidney Disease.    Anion gap 11 5 - 15    Comment: Performed at [facility], [address] [place], [place] [address]  Ethanol     Status: Abnormal   Collection Time: 01/09/17 11:22 PM  Result Value Ref Range   Alcohol, Ethyl (B) 117 (H) <10 mg/dL    Comment:        LOWEST DETECTABLE LIMIT FOR SERUM ALCOHOL IS 10 mg/dL FOR MEDICAL PURPOSES ONLY Performed at [facility], [address], [place], [place] [address]   Salicylate level     Status: None   Collection Time: 01/09/17 11:22 PM  Result Value Ref Range   Salicylate Lvl <2.9 2.8 - 30.0 mg/dL    Comment: Performed at [facility], [address] [place], [place] [address]  Acetaminophen level     Status: Abnormal   Collection Time: 01/09/17 11:22 PM  Result Value Ref Range   Acetaminophen (Tylenol), Serum <10 (L) 10 - 30 ug/mL    Comment:        THERAPEUTIC CONCENTRATIONS VARY SIGNIFICANTLY. A RANGE OF 10-30 ug/mL MAY BE AN EFFECTIVE CONCENTRATION FOR MANY PATIENTS. HOWEVER, SOME ARE BEST TREATED AT CONCENTRATIONS OUTSIDE THIS RANGE. ACETAMINOPHEN CONCENTRATIONS >150 ug/mL AT 4 HOURS AFTER INGESTION AND >50 ug/mL AT 12 HOURS AFTER INGESTION ARE OFTEN ASSOCIATED WITH TOXIC REACTIONS. Performed at [facility], [address] [place], [place] [address]   cbc     Status: None   Collection  Time: 01/09/17 11:22 PM  Result Value Ref Range   WBC 10.1 3.6 - 11.0 K/uL    RBC 4.27 3.80 - 5.20 MIL/uL   Hemoglobin 12.9 12.0 - 16.0 g/dL   HCT 37.8 35.0 - 47.0 %   MCV 88.5 80.0 - 100.0 fL   MCH 30.1 26.0 - 34.0 pg   MCHC 34.1 32.0 - 36.0 g/dL   RDW 12.6 11.5 - 14.5 %   Platelets 338 150 - 440 K/uL    Comment: Performed at [facility], [address], [place], [place] [address]    Blood Alcohol level:  Lab Results  Component Value Date   ETH 117 (H) 03/55/9741    Metabolic Disorder Labs:  No results found for: HGBA1C, MPG No results found for: PROLACTIN No results found for: CHOL, TRIG, HDL, CHOLHDL, VLDL, LDLCALC  Current Medications: Current Facility-Administered Medications  Medication Dose Route Frequency Provider Last Rate Last Dose  . acetaminophen (TYLENOL) tablet 650 mg  650 mg Oral Q6H PRN Santasia Rew B, MD      . albuterol (PROVENTIL HFA;VENTOLIN HFA) 108 (90 Base) MCG/ACT inhaler 2 puff  2 puff Inhalation Q4H PRN Linnie Delgrande B, MD      . alum & mag hydroxide-simeth (MAALOX/MYLANTA) 200-200-20 MG/5ML suspension 30 mL  30 mL Oral Q4H PRN Batu Cassin B, MD      . gabapentin (NEURONTIN) capsule 100 mg  100 mg Oral TID Gabbriella Presswood B, MD   100 mg at 01/11/17 1149  . hydrOXYzine (ATARAX/VISTARIL) tablet 25 mg  25 mg Oral TID PRN Makai Agostinelli B, MD   25 mg at 01/11/17 0742  . magnesium hydroxide (MILK OF MAGNESIA) suspension 30 mL  30 mL Oral Daily PRN Amberli Ruegg B, MD      . nicotine (NICODERM CQ - dosed in mg/24 hours) patch 21 mg  21 mg Transdermal Q0600 Manny Vitolo B, MD   21 mg at 01/11/17 0742  . polymixin-bacitracin (POLYSPORIN) ointment   Topical QPC breakfast Gerold Sar B, MD      . sertraline (ZOLOFT) tablet 150 mg  150 mg Oral Daily Gillermo Poch B, MD      . traZODone (DESYREL) tablet 100 mg  100 mg Oral QHS Blayne Frankie B, MD       PTA Medications: Medications Prior to Admission  Medication Sig Dispense Refill Last Dose  . albuterol (PROVENTIL  HFA;VENTOLIN HFA) 108 (90 Base) MCG/ACT inhaler Inhale 2 puffs into the lungs every 4 (four) hours as needed for wheezing or shortness of breath. (Patient not taking: Reported on 01/10/2017) 1 Inhaler 0 Not Taking at Unknown time  . methylPREDNISolone (MEDROL DOSEPAK) 4 MG TBPK tablet Take as directed (Patient not taking: Reported on 01/10/2017) 21 tablet 0 Completed Course at Unknown time    Musculoskeletal: Strength & Muscle Tone: within normal limits Gait & Station: normal Patient leans: N/A  Psychiatric Specialty Exam: Physical Exam  Nursing note and vitals reviewed. Constitutional: She is oriented to person, place, and time. She appears well-developed and well-nourished.  HENT:  Head: Normocephalic and atraumatic.  Eyes: Conjunctivae and EOM are normal. Pupils are equal, round, and reactive to light.  Neck: Normal range of motion. Neck supple.  Cardiovascular: Normal rate and regular rhythm.  Respiratory: Effort normal and breath sounds normal.  GI: Soft. Bowel sounds are normal.  Musculoskeletal: Normal range of motion.  Neurological: She is alert and oriented to person, place, and time.  Skin: Skin is warm and dry.  Sutured laceration of the right forearm covered with dressing   Psychiatric: She has a normal mood and affect. Her speech is normal and behavior is normal. Judgment and thought content normal. Cognition and memory are normal.    Review of Systems  Neurological: Negative.   Psychiatric/Behavioral: Positive for depression and substance abuse. The patient is nervous/anxious.   All other systems reviewed and are negative.   Blood pressure 123/73, pulse 67, temperature 98.6 F (37 C), temperature source Oral, resp. rate 18, height 5' 3"  (1.6 m), weight 53.5 kg (118 lb), last menstrual period 01/09/2017, SpO2 99 %.Body mass index is 20.9 kg/m.  See SRA.                                                  Sleep:       Treatment Plan  Summary: Daily contact with patient to assess and evaluate symptoms and progress in treatment and Medication management   Ms. Pawlicki is a 21 year old homosexual female with a history of depression and anxiety admitted after deeply cutting her forearm in the context of relationship problems while drunk.  #Suicidal ideation -patient able to contract for safety in the hospital  #Mood -continue Zoloft 150 mg daily  #Alcohol abuse -patient denies daily drinking, VS are stable -continue Neurontin 100 mg TID  #Insomnia -Trazodone is available  #Cut to the forearm -daily dressing changes -Neosporin -sutures removal per ER recommendation  #Asthma -Albuterol  #Smoking cessation -nicotine patch is availbale  #Disposition -discharge with family -follow up with [facility]   Observation Level/Precautions:  15 minute checks  Laboratory:  CBC Chemistry Profile UDS UA  Psychotherapy:    Medications:    Consultations:    Discharge Concerns:    Estimated LOS:  Other:     Physician Treatment Plan for Primary Diagnosis: Major depressive disorder, recurrent severe without psychotic features ([place]) Long Term Goal(s): Improvement in symptoms so as ready for discharge  Short Term Goals: Ability to identify changes in lifestyle to reduce recurrence of condition will improve, Ability to verbalize feelings will improve, Ability to disclose and discuss suicidal ideas, Ability to demonstrate self-control will improve, Ability to identify and develop effective coping behaviors will improve and Ability to identify triggers associated with substance abuse/mental health issues will improve  Physician Treatment Plan for Secondary Diagnosis: Principal Problem:   Major depressive disorder, recurrent severe without psychotic features ([place]) Active Problems:   Suicidal ideation   Self-inflicted laceration of wrist   Tobacco use disorder   Cannabis use disorder, moderate, dependence ([place])  Long Term  Goal(s): Improvement in symptoms so as ready for discharge  Short Term Goals: Ability to identify changes in lifestyle to reduce recurrence of condition will improve, Ability to demonstrate self-control will improve and Ability to identify triggers associated with substance abuse/mental health issues will improve  I certify that inpatient services furnished can reasonably be expected to improve the patient's condition.    Orson Slick, MD 12/30/20181:13 PM

## 2017-01-11 NOTE — [facility] Group Notes (Signed)
[facility] Group Notes:  (Nursing/MHT/Case Management/Adjunct)  Date:  01/11/2017  Time:  9:24 PM  Type of Therapy:  Group Therapy  Participation Level:  Active  Participation Quality:  Appropriate  Affect:  Appropriate  Cognitive:  Appropriate  Insight:  Appropriate  Engagement in Group:  Engaged  Modes of Intervention:  Discussion  Summary of Progress/Problems:  Chelsea Reese 01/11/2017, 9:24 PM

## 2017-01-11 NOTE — Progress Notes (Signed)
Patient presented this am tearful, reports feeling bad for making her mother worry. Patient then brightened early on. Noted socializing with staff and peers. Smiling, laughing playing basketball. Pt requested vistaril early a.m for increased anxiety with good relief. Pt refused to take gabapentin this evening. Pt noted tearful shaking when parents visit. Stopped soon as parents left. Smiling, talking and laughing with peers.  Pt medication and group compliant. Denies SI, Hi, AVH. Encouragement and support offered. Safety checks maintained. Pt receptive and remains safe on unit with q 15 min checks.

## 2017-01-12 NOTE — Progress Notes (Addendum)
1041 am patient discharged from BMU. Patient educated on discharge instructions and verbalized understanding. Patient verbalized understanding for follow up appointment and medication instructions. Patient received 7 day supply of medication. Patient wound was re-dressed.Patient denies SI, HI and AVH. Patient escorted to waiting room where parents where waiting to discharge patient in personal vehicle home.

## 2017-01-12 NOTE — Progress Notes (Signed)
Recreation Therapy Notes  Date: 12.31.2018   Time: 9:30 am   Location: Craft Room   Behavioral response: Appropriate   Intervention Topic: Coping skills   Discussion/Intervention: Group content on today was focused on coping skills. The group defined what coping skills are and when they can be used. Individuals described how they normally cope with thing and the coping skills they normally use. Patients expressed why it is important to cope with things and how not coping with things can affect you. The group participated in the intervention "My coping box" and made coping boxes while adding coping skills they could use in the future to the box. Clinical Observations/Feedback:  Patient came to group and stated that when people do not cope with things they end up here in the hospital. Patient left group early due to unknown reasons and never returned.  Nichollas Perusse LRT/CTRS         Trenity Pha 01/12/2017 10:30 AM

## 2017-01-12 NOTE — Progress Notes (Addendum)
Patient is alert and oriented X 4. Patient denies SI, HI and AVH. Patient is compliant with medications. Patient affect is pleasant, but restless and wants to go home. Patient lacerations on left arm are pink, no signs of infection. Antibacteria ointment applied. Patient states, " I need air to get to the wound so it will heal." Safety checks will continue Q 15 minutes until discharged.

## 2017-01-12 NOTE — [facility] Suicide Risk Assessment (Signed)
[facility]   Principal Problem: Major depressive disorder, recurrent severe without psychotic features [facility](HCC) Discharge Diagnoses:  Patient Active Problem List   Diagnosis Date Noted  . Major depressive disorder, recurrent severe without psychotic features ([facility]) [F33.2] 01/10/2017    Priority: High  . Cannabis use disorder, moderate, dependence ([facility]) [F12.20] 01/11/2017  . Suicidal ideation [R45.851] 01/10/2017  . Self-inflicted laceration of wrist [S61.519A] 01/10/2017  . Tobacco use disorder [F17.200] 01/10/2017    Total Time spent with patient: 30 minutes  Musculoskeletal: Strength & Muscle Tone: within normal limits Gait & Station: normal Patient leans: N/A  Psychiatric Specialty Exam: Review of Systems  Neurological: Negative.   Psychiatric/Behavioral: Positive for substance abuse.  All other systems reviewed and are negative.   Blood pressure 121/84, pulse 76, temperature 98.3 F (36.8 C), temperature source Oral, resp. rate 17, height 5\' 3"  (1.6 m), weight 53.5 kg (118 lb), last menstrual period 01/09/2017, SpO2 100 %.Body mass index is 20.9 kg/m.  General Appearance: Casual  Eye Contact::  Good  Speech:  Clear and Coherent409  Volume:  Normal  Mood:  Euthymic  Affect:  Appropriate  Thought Process:  Goal Directed and Descriptions of Associations: Intact  Orientation:  Full (Time, Place, and Person)  Thought Content:  WDL  Suicidal Thoughts:  No  Homicidal Thoughts:  No  Memory:  Immediate;   Fair Recent;   Fair Remote;   Fair  Judgement:  Poor  Insight:  Lacking  Psychomotor Activity:  Normal  Concentration:  Fair  Recall:  Fis[employer]Fair  Fund of Knowledge:Fair  Language: Fair  Akathisia:  No  Handed:  Right  AIMS (if indicated):     Assets:  Communication Skills Desire for Improvement Financial Resources/Insurance Housing Physical Health Resilience Social Support Transportation Vocational/Educational  Sleep:     Cognition: WNL   ADL's:  Intact   Mental Status Per Nursing Assessment::   On Admission:     Demographic Factors:  Adolescent or young adult and Gay, lesbian, or bisexual orientation  Loss Factors: Loss of significant relationship  Historical Factors: Prior suicide attempts, Family history of mental illness or substance abuse and Impulsivity  Risk Reduction Factors:   Sense of responsibility to family, Employed, Living with another person, especially a relative and Positive social support  Continued Clinical Symptoms:  Depression:   Comorbid alcohol abuse/dependence Impulsivity Alcohol/Substance Abuse/Dependencies  Cognitive Features That Contribute To Risk:  None    Suicide Risk:  Minimal: No identifiable suicidal ideation.  Patients presenting with no risk factors but with morbid ruminations; may be classified as minimal risk based on the severity of the depressive symptoms    Plan Of Care/Follow-up recommendations:  Activity:  as tolerated Diet:  regular Other:  keep follow up appointments  Kristine LineaJolanta Jelina Paulsen, MD 01/12/2017, 9:56 AM

## 2017-01-12 NOTE — Discharge Summary (Signed)
Physician Discharge Summary Note  Patient:  Chelsea Reese is an 21 y.o., female MRN:  [id number] DOB:  Apr 09, 1995 Patient phone:  [phone] (home)  Patient address:   [address] [place] [address],  Total Time spent with patient: 30 minutes  Date of Admission:  01/10/2017 Date of Discharge: 01/12/2017  Reason for Admission:  Suicide attempt  Identifying data. Ms. Chelsea Reese is a 21 year old female with a history of depression and self injurious behavior.  Chief complaint. "I don't know why I did it."  History of present illness. Information was obtained from the patient and the chart. The patient was brought to the ER after cutting her forearm with a razor so deeply that it required stiches. She was upset that day as she was chased in a car bey her ex-girlfriend's mother. She was sad and upset. She bought a bottle of vodka and started drinking. To make things worse, her mother who is very supportive was out of town. She was listening to sad music, wrote suicide note and cut herself. There is a long history of depression and anxiety at least since the age of 21. She used to cut her legs superficially but has not done it in 6 years. Reports sadness, poor concentration, social isolation crying spells. She is not chronically suicidal and cut herself impulsively. There are no psychotic symptoms. She reports severe social anxiety and OCD when young but not lately. She does not believe she has a drinking problem. She does not use drugs.  Past psychiatric history. Long standing depression and anxiety worse in middle school. She came out in high school she started cutting and had therapy for it. No suicide attempts or hospitalizations. She has been taking Zoloft 100-150 mg daily for the past 6 months with improvement. Zoloft has been prescribed for her mother.  Family psychiatric history. Mother with depression.   Social history. 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 She just broke up with her girlfriend of 4 years. She lives with her parents and her brother.   Principal Problem: Major depressive disorder, recurrent severe without psychotic features [facility](HCC) Discharge Diagnoses: Patient Active Problem List   Diagnosis Date Noted  . Major depressive disorder, recurrent severe without psychotic features (HCC) [F33.2] 01/10/2017    Priority: High  . Cannabis use disorder, moderate, dependence (HCC) [F12.20] 01/11/2017  . Suicidal ideation [R45.851] 01/10/2017  . Self-inflicted laceration of wrist [S61.519A] 01/10/2017  . Tobacco use disorder [F17.200] 01/10/2017    Past Medical History:  Past Medical History:  Diagnosis Date  . Asthma   . Deliberate self-cutting    History reviewed. No pertinent surgical history. Family History: History reviewed. No pertinent family history.  Social History:  Social History   Substance and Sexual Activity  Alcohol Use Yes   Comment: Pt reports half bottle of liquor every other day     Social History   Substance and Sexual Activity  Drug Use Yes  . Types: Marijuana    Social History   Socioeconomic History  . Marital status: Single    Spouse name: None  . Number of children: None  . Years of education: None  . Highest education level: None  Social Needs  . Financial resource strain: None  . Food insecurity - worry: None  . Food insecurity - inability: None  . Transportation needs - medical: None  . Transportation needs - non-medical: None  Occupational History  .  None  Tobacco Use  . Smoking status: Current Some Day Smoker    Types: Cigarettes  . Smokeless tobacco: Never Used  Substance and Sexual Activity  . Alcohol use: Yes    Comment: Pt reports half bottle of liquor every other day  . Drug use: Yes    Types: Marijuana  . Sexual activity: Yes  Other Topics Concern  . None  Social History Narrative  . None    Hospital Course:    Ms.  Chelsea Reese is a 21 year old homosexual female with a history of depression and anxiety admitted after deeply cutting her forearm in the context of relationship problems while drunk. Suicidal ideation has resolved. The patient is able to contract for safety. She is forward thinking and optimistic about the future. She is a loving daughter and sister.   #Mood, improved -continue Zoloft 150 mg daily  #Alcohol abuse -patient denies daily drinking, VS are stable  #Insomnia -Trazodone was available  #Cut to the forearm -daily dressing changes -Neosporin -sutures removal in 10 days per ER recommendation  #Asthma -continue Albuterol  #Smoking cessation -nicotine patch was availbale  #Disposition -family meeting completed before discharge -discharge with family -follow up with [facility]  Physical Findings: AIMS: Facial and Oral Movements Muscles of Facial Expression: None, normal Lips and Perioral Area: None, normal Jaw: None, normal Tongue: None, normal,Extremity Movements Upper (arms, wrists, hands, fingers): None, normal Lower (legs, knees, ankles, toes): None, normal, Trunk Movements Neck, shoulders, hips: None, normal, Overall Severity Severity of abnormal movements (highest score from questions above): None, normal Incapacitation due to abnormal movements: None, normal Patient's awareness of abnormal movements (rate only patient's report): No Awareness, Dental Status Current problems with teeth and/or dentures?: No Does patient usually wear dentures?: No  CIWA:  CIWA-Ar Total: 0 COWS:  COWS Total Score: 0  Musculoskeletal: Strength & Muscle Tone: within normal limits Gait & Station: normal Patient leans: N/A  Psychiatric Specialty Exam: Physical Exam  Nursing note and vitals reviewed. Psychiatric: She has a normal mood and affect. Her speech is normal and behavior is normal. Judgment and thought content normal. Cognition and memory are normal.    Review of Systems   Neurological: Negative.   Psychiatric/Behavioral: Negative.   All other systems reviewed and are negative.   Blood pressure 121/84, pulse 76, temperature 98.3 F (36.8 C), temperature source Oral, resp. rate 17, height 5\' 3"  (1.6 m), weight 53.5 kg (118 lb), last menstrual period 01/09/2017, SpO2 100 %.Body mass index is 20.9 kg/m.  General Appearance: Casual  Eye Contact:  Good  Speech:  Clear and Coherent  Volume:  Normal  Mood:  Euthymic  Affect:  Appropriate  Thought Process:  Goal Directed and Descriptions of Associations: Intact  Orientation:  Full (Time, Place, and Person)  Thought Content:  WDL  Suicidal Thoughts:  No  Homicidal Thoughts:  No  Memory:  Immediate;   Fair Recent;   Fair Remote;   Fair  Judgement:  Impaired  Insight:  Lacking  Psychomotor Activity:  Normal  Concentration:  Concentration: Fair and Attention Span: Fair  Recall:  [employer] of Knowledge:  Fair  Language:  Fair  Akathisia:  No  Handed:  Right  AIMS (if indicated):     Assets:  Communication Skills Desire for Improvement Housing Physical Health Resilience Social Support Transportation Vocational/Educational  ADL's:  Intact  Cognition:  WNL  Sleep:        Have you used any form of tobacco in the last 30  days? (Cigarettes, Smokeless Tobacco, Cigars, and/or Pipes): Yes  Has this patient used any form of tobacco in the last 30 days? (Cigarettes, Smokeless Tobacco, Cigars, and/or Pipes) Yes, Yes, A prescription for an FDA-approved tobacco cessation medication was offered at discharge and the patient refused  Blood Alcohol level:  Lab Results  Component Value Date   ETH 117 (H) 01/09/2017    Metabolic Disorder Labs:  No results found for: HGBA1C, MPG No results found for: PROLACTIN No results found for: CHOL, TRIG, HDL, CHOLHDL, VLDL, LDLCALC  See Psychiatric Specialty Exam and Suicide Risk Assessment completed by Attending Physician prior to discharge.  Discharge destination:   Home  Is patient on multiple antipsychotic therapies at discharge:  No   Has Patient had three or more failed trials of antipsychotic monotherapy by history:  No  Recommended Plan for Multiple Antipsychotic Therapies: NA  Discharge Instructions    Diet - low sodium heart healthy   Complete by:  As directed    Increase activity slowly   Complete by:  As directed      Allergies as of 01/12/2017   No Known Allergies     Medication List    STOP taking these medications   methylPREDNISolone 4 MG Tbpk tablet Commonly known as:  MEDROL DOSEPAK     TAKE these medications     Indication  albuterol 108 (90 Base) MCG/ACT inhaler Commonly known as:  PROVENTIL HFA;VENTOLIN HFA Inhale 2 puffs into the lungs every 4 (four) hours as needed for wheezing or shortness of breath.  Indication:  Asthma   sertraline 50 MG tablet Commonly known as:  ZOLOFT Take 3 tablets (150 mg total) by mouth daily.  Indication:  Major Depressive Disorder      Follow-up Information    [facility] Go on 01/16/2017.   Why:  12:30am Hospital Follow up and Medication Managment, Please keep in touch with [facility] Peer Support Services if any questions, [phone] Contact information: [address] [place] [address] [phone]           Follow-up recommendations:  Activity:  as tolerated Diet:  regular Other:  keep follow up appointments  Comments:     Signed: Kristine LineaJolanta Lottie Sigman, MD 01/12/2017, 11:29 AM

## 2017-01-12 NOTE — Tx Team (Signed)
Interdisciplinary Treatment and Diagnostic Plan Update  01/12/2017 Time of Session: 10:55am Chelsea Reese MRN: [id number]  Principal Diagnosis: Major depressive disorder, recurrent severe without psychotic features (HCC)  Secondary Diagnoses: Principal Problem:   Major depressive disorder, recurrent severe without psychotic features (HCC) Active Problems:   Suicidal ideation   Self-inflicted laceration of wrist   Tobacco use disorder   Cannabis use disorder, moderate, dependence (HCC)   Current Medications:  No current facility-administered medications for this encounter.    Current Outpatient Medications  Medication Sig Dispense Refill  . albuterol (PROVENTIL HFA;VENTOLIN HFA) 108 (90 Base) MCG/ACT inhaler Inhale 2 puffs into the lungs every 4 (four) hours as needed for wheezing or shortness of breath. (Patient not taking: Reported on 01/10/2017) 1 Inhaler 0  . sertraline (ZOLOFT) 50 MG tablet Take 3 tablets (150 mg total) by mouth daily. 90 tablet 1   PTA Medications: No medications prior to admission.    Patient Stressors: Marital or family conflict Substance abuse Traumatic event  Patient Strengths: Ability for insight Active sense of humor Average or above average intelligence Capable of independent living MetallurgistCommunication skills Financial means General fund of knowledge Motivation for treatment/growth Physical Health Special hobby/interest Supportive family/friends Work skills  Treatment Modalities: Medication Management, Group therapy, Case management,  1 to 1 session with clinician, Psychoeducation, Recreational therapy.   Physician Treatment Plan for Primary Diagnosis: Major depressive disorder, recurrent severe without psychotic features (HCC) Long Term Goal(s): Improvement in symptoms so as ready for discharge Improvement in symptoms so as ready for discharge   Short Term Goals: Ability to identify changes in lifestyle to reduce recurrence of condition  will improve Ability to verbalize feelings will improve Ability to disclose and discuss suicidal ideas Ability to demonstrate self-control will improve Ability to identify and develop effective coping behaviors will improve Ability to identify triggers associated with substance abuse/mental health issues will improve Ability to identify changes in lifestyle to reduce recurrence of condition will improve Ability to demonstrate self-control will improve Ability to identify triggers associated with substance abuse/mental health issues will improve  Medication Management: Evaluate patient's response, side effects, and tolerance of medication regimen.  Therapeutic Interventions: 1 to 1 sessions, Unit Group sessions and Medication administration.  Evaluation of Outcomes: Adequate for Discharge  Physician Treatment Plan for Secondary Diagnosis: Principal Problem:   Major depressive disorder, recurrent severe without psychotic features (HCC) Active Problems:   Suicidal ideation   Self-inflicted laceration of wrist   Tobacco use disorder   Cannabis use disorder, moderate, dependence (HCC)  Long Term Goal(s): Improvement in symptoms so as ready for discharge Improvement in symptoms so as ready for discharge   Short Term Goals: Ability to identify changes in lifestyle to reduce recurrence of condition will improve Ability to verbalize feelings will improve Ability to disclose and discuss suicidal ideas Ability to demonstrate self-control will improve Ability to identify and develop effective coping behaviors will improve Ability to identify triggers associated with substance abuse/mental health issues will improve Ability to identify changes in lifestyle to reduce recurrence of condition will improve Ability to demonstrate self-control will improve Ability to identify triggers associated with substance abuse/mental health issues will improve     Medication Management: Evaluate patient's  response, side effects, and tolerance of medication regimen.  Therapeutic Interventions: 1 to 1 sessions, Unit Group sessions and Medication administration.  Evaluation of Outcomes: Adequate for Discharge   RN Treatment Plan for Primary Diagnosis: Major depressive disorder, recurrent severe without psychotic features (HCC) Long Term  Goal(s): Knowledge of disease and therapeutic regimen to maintain health will improve  Short Term Goals: Ability to remain free from injury will improve, Ability to identify and develop effective coping behaviors will improve and Compliance with prescribed medications will improve  Medication Management: RN will administer medications as ordered by provider, will assess and evaluate patient's response and provide education to patient for prescribed medication. RN will report any adverse and/or side effects to prescribing provider.  Therapeutic Interventions: 1 on 1 counseling sessions, Psychoeducation, Medication administration, Evaluate responses to treatment, Monitor vital signs and CBGs as ordered, Perform/monitor CIWA, COWS, AIMS and Fall Risk screenings as ordered, Perform wound care treatments as ordered.  Evaluation of Outcomes: Adequate for Discharge   LCSW Treatment Plan for Primary Diagnosis: Major depressive disorder, recurrent severe without psychotic features (HCC) Long Term Goal(s): Safe transition to appropriate next level of care at discharge, Engage patient in therapeutic group addressing interpersonal concerns.  Short Term Goals: Engage patient in aftercare planning with referrals and resources, Identify triggers associated with mental health/substance abuse issues and Increase skills for wellness and recovery  Therapeutic Interventions: Assess for all discharge needs, 1 to 1 time with Social worker, Explore available resources and support systems, Assess for adequacy in community support network, Educate family and significant other(s) on  suicide prevention, Complete Psychosocial Assessment, Interpersonal group therapy.  Evaluation of Outcomes: Adequate for Discharge   Progress in Treatment: Attending groups: Yes. Participating in groups: Yes. Taking medication as prescribed: Yes. Toleration medication: Yes. Family/Significant other contact made:  Patient understands diagnosis: Yes. Discussing patient identified problems/goals with staff: Yes. Medical problems stabilized or resolved: Yes. Denies suicidal/homicidal ideation: Yes. Issues/concerns per patient self-inventory: No. Other:    New problem(s) identified: No, Describe:     New Short Term/Long Term Goal(s): to be discharged home and make better choices for herself  Discharge Plan or Barriers: [facility]  Reason for Continuation of Hospitalization: None Estimated Length of Stay: 0 days  Attendees: Patient:Chelsea Reese 01/12/2017 4:49 PM  Physician: Kristine LineaJolanta Pucilowska, MD 01/12/2017 4:49 PM  Nursing: Leonia ReaderPhyllis Cobb, RN 01/12/2017 4:49 PM  RN Care Manager: 01/12/2017 4:49 PM  Social Worker: Jake SharkSara Asli Tokarski, LCSW 01/12/2017 4:49 PM  Recreational Therapist: Garret ReddishShay Outlaw, LRT 01/12/2017 4:49 PM  Other:  01/12/2017 4:49 PM  Other:  01/12/2017 4:49 PM  Other: 01/12/2017 4:49 PM    Scribe for Treatment Team: Glennon MacSara P Lottie Siska, LCSW 01/12/2017 4:49 PM

## 2017-04-23 ENCOUNTER — Emergency Department
Admission: EM | Admit: 2017-04-23 | Discharge: 2017-04-23 | Disposition: A | Discharge status: Home / Self Care | Payer: Self-pay | Attending: Emergency Medicine | Admitting: Emergency Medicine

## 2017-04-23 ENCOUNTER — Encounter: Payer: Self-pay | Admitting: Emergency Medicine

## 2017-04-23 DIAGNOSIS — J029 Acute pharyngitis, unspecified: Secondary | ICD-10-CM | POA: Insufficient documentation

## 2017-04-23 DIAGNOSIS — F17210 Nicotine dependence, cigarettes, uncomplicated: Secondary | ICD-10-CM | POA: Insufficient documentation

## 2017-04-23 DIAGNOSIS — F1220 Cannabis dependence, uncomplicated: Secondary | ICD-10-CM | POA: Insufficient documentation

## 2017-04-23 DIAGNOSIS — J45909 Unspecified asthma, uncomplicated: Secondary | ICD-10-CM | POA: Insufficient documentation

## 2017-04-23 DIAGNOSIS — Z79899 Other long term (current) drug therapy: Secondary | ICD-10-CM | POA: Insufficient documentation

## 2017-04-23 DIAGNOSIS — F329 Major depressive disorder, single episode, unspecified: Secondary | ICD-10-CM | POA: Insufficient documentation

## 2017-04-23 MED ORDER — AMOXICILLIN 500 MG PO TABS: 500.0000 mg | ORAL_TABLET | Freq: Two times a day (BID) | ORAL | 0 refills | Status: DC

## 2017-04-23 NOTE — ED Provider Notes (Signed)
[facility] Emergency Department Provider Note  ____________________________________________  Time seen: Approximately 1:53 PM  I have reviewed the triage vital signs and the nursing notes.   HISTORY  Chief Complaint Sore Throat and Fever    HPI Chelsea Reese is a 22 y.o. female who presents to the emergency department for treatment and evaluation of sore throat.Symptoms started 3-4 days ago, but she reports a subjective fever last night.  No relief with over-the-counter medications.  Past Medical History:  Diagnosis Date  . Asthma   . Deliberate self-cutting     Patient Active Problem List   Diagnosis Date Noted  . Cannabis use disorder, moderate, dependence (HCC) 01/11/2017  . Major depressive disorder, recurrent severe without psychotic features (HCC) 01/10/2017  . Suicidal ideation 01/10/2017  . Self-inflicted laceration of wrist 01/10/2017  . Tobacco use disorder 01/10/2017    History reviewed. No pertinent surgical history.  Prior to Admission medications   Medication Sig Start Date End Date Taking? Authorizing Provider  albuterol (PROVENTIL HFA;VENTOLIN HFA) 108 (90 Base) MCG/ACT inhaler Inhale 2 puffs into the lungs every 4 (four) hours as needed for wheezing or shortness of breath. Patient not taking: Reported on 01/10/2017 10/25/16   Irean Hong, MD  amoxicillin (AMOXIL) 500 MG tablet Take 1 tablet (500 mg total) by mouth 2 (two) times daily. 04/23/17   Mariaelena Cade B, FNP  sertraline (ZOLOFT) 50 MG tablet Take 3 tablets (150 mg total) by mouth daily. 01/11/17   Pucilowska, Ellin Goodie, MD    Allergies Patient has no known allergies.  No family history on file.  Social History Social History   Tobacco Use  . Smoking status: Current Some Day Smoker    Types: Cigarettes  . Smokeless tobacco: Never Used  Substance Use Topics  . Alcohol use: Yes    Comment: Pt reports half bottle of liquor every other day  . Drug use: Yes     Types: Marijuana    Review of Systems Constitutional: Positive for fever. Eyes: No visual changes. ENT: Positive for sore throat; negative for difficulty swallowing. Respiratory: Denies shortness of breath. Gastrointestinal: No abdominal pain.  No nausea, no vomiting.  No diarrhea.  Genitourinary: Negative for dysuria. Musculoskeletal: Positive for generalized body aches. Skin: Negative for rash. Neurological: Positive for headaches, negative focal weakness or numbness.  ____________________________________________   PHYSICAL EXAM:  VITAL SIGNS: ED Triage Vitals  Enc Vitals Group     BP 04/23/17 1039 131/71     Pulse Rate 04/23/17 1039 91     Resp 04/23/17 1039 20     Temp 04/23/17 1039 98.8 F (37.1 C)     Temp Source 04/23/17 1039 Oral     SpO2 04/23/17 1039 97 %     Weight 04/23/17 1034 120 lb (54.4 kg)     Height 04/23/17 1034 5\' 3"  (1.6 m)     Head Circumference --      Peak Flow --      Pain Score 04/23/17 1034 6     Pain Loc --      Pain Edu? --      Excl. in GC? --    Constitutional: Alert and oriented. Well appearing and in no acute distress. Eyes: Conjunctivae are normal.  Head: Atraumatic. Nose: No congestion/rhinnorhea. Mouth/Throat: Mucous membranes are moist.  Oropharynx erythematous with petechiae, tonsils not visualized no exudate. Uvula is midline. Neck: No stridor.  Lymphatic: Lymphadenopathy: Bilateral anterior cervical adenopathy Cardiovascular: Normal rate, regular rhythm. Good  peripheral circulation. Respiratory: Normal respiratory effort. Lungs CTAB. Gastrointestinal: Soft and nontender. Musculoskeletal: No lower extremity tenderness nor edema.  Neurologic:  Normal speech and language. No gross focal neurologic deficits are appreciated. Speech is normal. No gait instability. Skin:  Skin is warm, dry and intact. No rash noted Psychiatric: Mood and affect are normal. Speech and behavior are  normal.  ____________________________________________   LABS (all labs ordered are listed, but only abnormal results are displayed)  Labs Reviewed - No data to display ____________________________________________  EKG  Not indicated ____________________________________________  RADIOLOGY  None ____________________________________________   PROCEDURES  Procedure(s) performed: None  Critical Care performed: No ____________________________________________   INITIAL IMPRESSION / ASSESSMENT AND PLAN / ED COURSE  22 year old female presenting to the emergency department for treatment and evaluation of sore throat.  Exudate is present on the posterior oropharynx and petechiae as noted.  She will be treated with amoxicillin and advised to take Tylenol or ibuprofen if needed for pain or fever.  She was encouraged to return to the emergency department for symptoms of change or worsen if she is unable to schedule an appointment with her primary care provider.  Pertinent labs & imaging results that were available during my care of the patient were reviewed by me and considered in my medical decision making (see chart for details). ____________________________________________  Discharge Medication List as of 04/23/2017 11:00 AM    START taking these medications   Details  amoxicillin (AMOXIL) 500 MG tablet Take 1 tablet (500 mg total) by mouth 2 (two) times daily., Starting Thu 04/23/2017, Print        FINAL CLINICAL IMPRESSION(S) / ED DIAGNOSES  Final diagnoses:  Exudative pharyngitis    If controlled substance prescribed during this visit, 12 month history viewed on the [facility] prior to issuing an initial prescription for Schedule II or III opiod.   Note:  This document was prepared using Dragon voice recognition software and may include unintentional dictation errors.    Chinita Pesterriplett, Zyara Riling B, FNP 04/23/17 1400    Jeanmarie PlantMcShane, James A, MD 04/27/17 [phone]

## 2017-04-23 NOTE — ED Triage Notes (Signed)
Pt reports sore throat for the past couple of days with a fever starting last night.

## 2017-04-23 NOTE — ED Notes (Signed)
Pt ambulatory upon discharge. Verbalized understanding of discharge instructions, follow-up care and prescription. Skin warm and dry. A&O x4.

## 2017-04-23 NOTE — ED Notes (Signed)
See triage note   Presents with sore throat for couple days  Increased pain this am   Also developed fever last pm  Afebrile on arrival

## 2017-07-14 DIAGNOSIS — J45909 Unspecified asthma, uncomplicated: Secondary | ICD-10-CM | POA: Insufficient documentation

## 2017-07-14 DIAGNOSIS — F1220 Cannabis dependence, uncomplicated: Secondary | ICD-10-CM | POA: Insufficient documentation

## 2017-07-14 DIAGNOSIS — F329 Major depressive disorder, single episode, unspecified: Secondary | ICD-10-CM | POA: Insufficient documentation

## 2017-07-14 DIAGNOSIS — F10120 Alcohol abuse with intoxication, uncomplicated: Secondary | ICD-10-CM | POA: Insufficient documentation

## 2017-07-14 DIAGNOSIS — Z79899 Other long term (current) drug therapy: Secondary | ICD-10-CM | POA: Insufficient documentation

## 2017-07-14 DIAGNOSIS — F17210 Nicotine dependence, cigarettes, uncomplicated: Secondary | ICD-10-CM | POA: Insufficient documentation

## 2017-07-15 ENCOUNTER — Emergency Department
Admission: EM | Admit: 2017-07-15 | Discharge: 2017-07-15 | Disposition: A | Discharge status: Home / Self Care | Payer: Self-pay | Attending: Emergency Medicine | Admitting: Emergency Medicine

## 2017-07-15 ENCOUNTER — Other Ambulatory Visit: Payer: Self-pay

## 2017-07-15 ENCOUNTER — Encounter: Payer: Self-pay | Admitting: *Deleted

## 2017-07-15 DIAGNOSIS — F329 Major depressive disorder, single episode, unspecified: Secondary | ICD-10-CM

## 2017-07-15 DIAGNOSIS — F32A Depression, unspecified: Secondary | ICD-10-CM

## 2017-07-15 DIAGNOSIS — F10920 Alcohol use, unspecified with intoxication, uncomplicated: Secondary | ICD-10-CM

## 2017-07-15 LAB — COMPREHENSIVE METABOLIC PANEL
ALK PHOS: 51 U/L (ref 38–126)
ALT: 13 U/L (ref 0–44)
ANION GAP: 9 (ref 5–15)
AST: 16 U/L (ref 15–41)
Albumin: 4.6 g/dL (ref 3.5–5.0)
BILIRUBIN TOTAL: 0.9 mg/dL (ref 0.3–1.2)
BUN: 5 mg/dL — ABNORMAL LOW (ref 6–20)
CALCIUM: 9.1 mg/dL (ref 8.9–10.3)
CO2: 24 mmol/L (ref 22–32)
Chloride: 107 mmol/L (ref 98–111)
Creatinine, Ser: 0.57 mg/dL (ref 0.44–1.00)
GFR calc Af Amer: >60 mL/min (ref >60)
GFR calc non Af Amer: >60 mL/min (ref >60)
Glucose, Bld: 96 mg/dL (ref 70–99)
Potassium: 3.6 mmol/L (ref 3.5–5.1)
SODIUM: 140 mmol/L (ref 135–145)
Total Protein: 8 g/dL (ref 6.5–8.1)

## 2017-07-15 LAB — CBC
HEMATOCRIT: 39.9 % (ref 35.0–47.0)
HEMOGLOBIN: 13.7 g/dL (ref 12.0–16.0)
MCH: 30.5 pg (ref 26.0–34.0)
MCHC: 34.3 g/dL (ref 32.0–36.0)
MCV: 89 fL (ref 80.0–100.0)
Platelets: 412 10*3/uL (ref 150–440)
RBC: 4.48 MIL/uL (ref 3.80–5.20)
RDW: 13.6 % (ref 11.5–14.5)
WBC: 8.4 10*3/uL (ref 3.6–11.0)

## 2017-07-15 LAB — ETHANOL: Alcohol, Ethyl (B): 119 mg/dL — ABNORMAL HIGH (ref <10)

## 2017-07-15 NOTE — ED Notes (Signed)
Patient's mother is here to pick up patient.

## 2017-07-15 NOTE — ED Triage Notes (Signed)
Pt reports feeling depressed.  Pt is tearful.  States etoh and drug use.  Denies SI or HI.  Pt cooperative.

## 2017-07-15 NOTE — ED Notes (Signed)
Pt. Alert and oriented, warm and dry, in no distress. Pt. Denies SI, HI, and AVH. Pt states she dont feel like she needs to be here and is requesting to leave.  Advised patient I would speak with EDP.

## 2017-07-15 NOTE — ED Notes (Signed)
Patient discharged home with mother picking patient up. Patient denies SI/HI/AVH and rehab info.  Discharge info reviewed with patient. Patient verbalized understanding and signed discharge.

## 2017-07-16 NOTE — ED Provider Notes (Signed)
[facility] Emergency Department Provider Note _____________   First MD Initiated Contact with Patient 07/15/17 0021     (approximate)  I have reviewed the triage vital signs and the nursing notes.   HISTORY  Chief Complaint Behavior Problem    HPI Chelsea Reese is a 22 y.o. female with below list of chronic medical conditions presents to the emergency department stating that she was feeling depressed.  Patient does admit to alcohol and marijuana use tonight.  Patient denies any suicidal or homicidal ideation.  Patient denies any auditory visual hallucinations.   Past Medical History:  Diagnosis Date  . Asthma   . Deliberate self-cutting     Patient Active Problem List   Diagnosis Date Noted  . Cannabis use disorder, moderate, dependence (HCC) 01/11/2017  . Major depressive disorder, recurrent severe without psychotic features (HCC) 01/10/2017  . Suicidal ideation 01/10/2017  . Self-inflicted laceration of wrist 01/10/2017  . Tobacco use disorder 01/10/2017    No past surgical history on file.  Prior to Admission medications   Medication Sig Start Date End Date Taking? Authorizing Provider  albuterol (PROVENTIL HFA;VENTOLIN HFA) 108 (90 Base) MCG/ACT inhaler Inhale 2 puffs into the lungs every 4 (four) hours as needed for wheezing or shortness of breath. Patient not taking: Reported on 01/10/2017 10/25/16   Irean HongSung, Jade J, MD  amoxicillin (AMOXIL) 500 MG tablet Take 1 tablet (500 mg total) by mouth 2 (two) times daily. 04/23/17   Triplett, Cari B, FNP  sertraline (ZOLOFT) 50 MG tablet Take 3 tablets (150 mg total) by mouth daily. 01/11/17   Pucilowska, Ellin GoodieJolanta B, MD    Allergies No known drug allergies No family history on file.  Social History Social History   Tobacco Use  . Smoking status: Current Some Day Smoker    Types: Cigarettes  . Smokeless tobacco: Never Used  Substance Use Topics  . Alcohol use: Yes    Comment: Pt reports  half bottle of liquor every other day  . Drug use: Yes    Types: Marijuana    Review of Systems Constitutional: No fever/chills Eyes: No visual changes. ENT: No sore throat. Cardiovascular: Denies chest pain. Respiratory: Denies shortness of breath. Gastrointestinal: No abdominal pain.  No nausea, no vomiting.  No diarrhea.  No constipation. Genitourinary: Negative for dysuria. Musculoskeletal: Negative for neck pain.  Negative for back pain. Integumentary: Negative for rash. Neurological: Negative for headaches, focal weakness or numbness. Psychiatric: Positive for feeling depressed.  Positive for alcohol and marijuana use  ____________________________________________   PHYSICAL EXAM:  VITAL SIGNS: ED Triage Vitals  Enc Vitals Group     BP 07/15/17 0203 116/88     Pulse Rate 07/15/17 0203 68     Resp 07/15/17 0203 16     Temp 07/15/17 0203 98.3 F (36.8 C)     Temp Source 07/15/17 0203 Oral     SpO2 07/15/17 0203 96 %     Weight 07/15/17 0005 56.7 kg (125 lb)     Height 07/15/17 0005 1.6 m (5\' 3" )     Head Circumference --      Peak Flow --      Pain Score 07/15/17 0005 0     Pain Loc --      Pain Edu? --      Excl. in GC? --     Constitutional: Alert and oriented. Well appearing and in no acute distress. Eyes: Conjunctivae are normal.  Head: Atraumatic. Neck: No stridor.  Cardiovascular: Normal rate, regular rhythm. Good peripheral circulation. Grossly normal heart sounds. Respiratory: Normal respiratory effort.  No retractions. Lungs CTAB. Gastrointestinal: Soft and nontender. No distention.  Musculoskeletal: No lower extremity tenderness nor edema. No gross deformities of extremities. Neurologic:  Normal speech and language. No gross focal neurologic deficits are appreciated.  Skin:  Skin is warm, dry and intact. No rash noted. Psychiatric: Mood and affect are normal. Speech and behavior are normal.  ____________________________________________    LABS (all labs ordered are listed, but only abnormal results are displayed)  Labs Reviewed  COMPREHENSIVE METABOLIC PANEL - Abnormal; Notable for the following components:      Result Value   BUN 5 (*)    All other components within normal limits  ETHANOL - Abnormal; Notable for the following components:   Alcohol, Ethyl (B) 119 (*)    All other components within normal limits  CBC   ___________________  Procedures   ____________________________________________   INITIAL IMPRESSION / ASSESSMENT AND PLAN / ED COURSE  As part of my medical decision making, I reviewed the following data within the electronic MEDICAL RECORD NUMBER   22 year old female presenting with above-stated history and physical exam secondary to feeling depressed and substance abuse.  On my arrival to the room the patient stated that she was feeling better at this time and was requesting discharge home.  Patient denied any suicidal or homicidal ideation.  Following my evaluation I offered detox to the patient which she refused.  Patient was discharged home in the custody of her mother.  ____________________________________________  FINAL CLINICAL IMPRESSION(S) / ED DIAGNOSES  Final diagnoses:  Depression, unspecified depression type  Alcoholic intoxication without complication (HCC)     MEDICATIONS GIVEN DURING THIS VISIT:  Medications - No data to display   ED Discharge Orders    None       Note:  This document was prepared using Dragon voice recognition software and may include unintentional dictation errors.    Darci Current, MD 07/16/17 [phone]

## 2021-03-15 ENCOUNTER — Encounter: Payer: Self-pay | Admitting: Emergency Medicine

## 2021-03-15 ENCOUNTER — Other Ambulatory Visit: Payer: Self-pay

## 2021-03-15 ENCOUNTER — Emergency Department
Admission: EM | Admit: 2021-03-15 | Discharge: 2021-03-16 | Disposition: A | Discharge status: Home / Self Care | Payer: Self-pay | Attending: Emergency Medicine | Admitting: Emergency Medicine

## 2021-03-15 DIAGNOSIS — Z5321 Procedure and treatment not carried out due to patient leaving prior to being seen by health care provider: Secondary | ICD-10-CM | POA: Insufficient documentation

## 2021-03-15 DIAGNOSIS — R1011 Right upper quadrant pain: Secondary | ICD-10-CM | POA: Insufficient documentation

## 2021-03-15 DIAGNOSIS — R112 Nausea with vomiting, unspecified: Secondary | ICD-10-CM | POA: Insufficient documentation

## 2021-03-15 LAB — URINALYSIS, ROUTINE W REFLEX MICROSCOPIC
Bilirubin Urine: NEGATIVE
Glucose, UA: NEGATIVE mg/dL
Hgb urine dipstick: NEGATIVE
Ketones, ur: NEGATIVE mg/dL
Nitrite: NEGATIVE
Protein, ur: NEGATIVE mg/dL
Specific Gravity, Urine: 1.009 (ref 1.005–1.030)
pH: 8 (ref 5.0–8.0)

## 2021-03-15 LAB — COMPREHENSIVE METABOLIC PANEL
ALT: 12 U/L (ref 0–44)
AST: 14 U/L — ABNORMAL LOW (ref 15–41)
Albumin: 4.8 g/dL (ref 3.5–5.0)
Alkaline Phosphatase: 42 U/L (ref 38–126)
Anion gap: 7 (ref 5–15)
BUN: 6 mg/dL (ref 6–20)
CO2: 25 mmol/L (ref 22–32)
Calcium: 9.6 mg/dL (ref 8.9–10.3)
Chloride: 103 mmol/L (ref 98–111)
Creatinine, Ser: 0.53 mg/dL (ref 0.44–1.00)
GFR, Estimated: >60 mL/min (ref >60)
Glucose, Bld: 89 mg/dL (ref 70–99)
Potassium: 3.7 mmol/L (ref 3.5–5.1)
Sodium: 135 mmol/L (ref 135–145)
Total Bilirubin: 0.5 mg/dL (ref 0.3–1.2)
Total Protein: 7.8 g/dL (ref 6.5–8.1)

## 2021-03-15 LAB — CBC
HCT: 39.6 % (ref 36.0–46.0)
Hemoglobin: 13.4 g/dL (ref 12.0–15.0)
MCH: 29.5 pg (ref 26.0–34.0)
MCHC: 33.8 g/dL (ref 30.0–36.0)
MCV: 87 fL (ref 80.0–100.0)
Platelets: 365 10*3/uL (ref 150–400)
RBC: 4.55 MIL/uL (ref 3.87–5.11)
RDW: 12.4 % (ref 11.5–15.5)
WBC: 13.3 10*3/uL — ABNORMAL HIGH (ref 4.0–10.5)
nRBC: 0 % (ref 0.0–0.2)

## 2021-03-15 LAB — POC URINE PREG, ED: Preg Test, Ur: NEGATIVE

## 2021-03-15 LAB — LIPASE, BLOOD: Lipase: 40 U/L (ref 11–51)

## 2021-03-15 NOTE — ED Notes (Signed)
Pt does not answer when called for repeat vital signs, no longer seen in lobby.  ?

## 2021-03-15 NOTE — ED Notes (Signed)
Called pt x2 for vital sign reassessment without answer. Pt not visualized in lobby.  ?

## 2021-03-15 NOTE — ED Triage Notes (Signed)
Pt in via POV, reports RUQ abdominal pain w/ N/V x 4 days.  Ambulatory to triage, vitals WDL, NAD noted at this time. ?

## 2021-03-16 ENCOUNTER — Emergency Department
Admission: EM | Admit: 2021-03-16 | Discharge: 2021-03-16 | Disposition: A | Discharge status: Home / Self Care | Payer: Self-pay | Attending: Emergency Medicine | Admitting: Emergency Medicine

## 2021-03-16 ENCOUNTER — Emergency Department: Payer: Self-pay

## 2021-03-16 DIAGNOSIS — R1084 Generalized abdominal pain: Secondary | ICD-10-CM | POA: Insufficient documentation

## 2021-03-16 DIAGNOSIS — R112 Nausea with vomiting, unspecified: Secondary | ICD-10-CM | POA: Insufficient documentation

## 2021-03-16 MED ORDER — SUCRALFATE 1 G PO TABS: 1.0000 g | ORAL_TABLET | Freq: Four times a day (QID) | ORAL | 0 refills | Status: AC

## 2021-03-16 MED ORDER — PANTOPRAZOLE SODIUM 40 MG IV SOLR
40.0000 mg | Freq: Once | INTRAVENOUS | Status: AC
Start: 2021-03-16 — End: 2021-03-16
  Administered 2021-03-16: 40 mg via INTRAVENOUS
  Filled 2021-03-16: qty 10

## 2021-03-16 MED ORDER — PANTOPRAZOLE SODIUM 40 MG PO TBEC: 40.0000 mg | DELAYED_RELEASE_TABLET | Freq: Every day | ORAL | 0 refills | Status: AC

## 2021-03-16 MED ORDER — MORPHINE SULFATE (PF) 4 MG/ML IV SOLN
4.0000 mg | Freq: Once | INTRAVENOUS | Status: AC
  Administered 2021-03-16: 4 mg via INTRAVENOUS
  Filled 2021-03-16: qty 1

## 2021-03-16 MED ORDER — METOCLOPRAMIDE HCL 10 MG PO TABS: 10.0000 mg | ORAL_TABLET | Freq: Three times a day (TID) | ORAL | 0 refills | Status: AC

## 2021-03-16 MED ORDER — IOHEXOL 300 MG/ML  SOLN
80.0000 mL | Freq: Once | INTRAMUSCULAR | Status: AC | PRN
  Administered 2021-03-16: 80 mL via INTRAVENOUS
  Filled 2021-03-16: qty 80

## 2021-03-16 MED ORDER — METOCLOPRAMIDE HCL 5 MG/ML IJ SOLN
10.0000 mg | Freq: Once | INTRAMUSCULAR | Status: AC
  Administered 2021-03-16: 10 mg via INTRAVENOUS
  Filled 2021-03-16: qty 2

## 2021-03-16 NOTE — ED Triage Notes (Signed)
Pt states she was seen last night for RUQ pain and n/v for now 5 days- pt states she went home last night before being seen d/t pain getting better and wanting to get some sleep- pt states she did not sleep much was still having the vomiting so her mom made her come back ?

## 2021-03-16 NOTE — ED Provider Triage Note (Signed)
Emergency Medicine Provider Triage Evaluation Note ? ?Chelsea Reese , a 26 y.o. female  was evaluated in triage.  Pt complains of 4 days of nausea and now vomiting.Chills but no fever.  No etoh, but vaps and smokes cannabis every day.  Abdominal pain with pain traveling from mid abdomen to sides at times.  Last BM 3 days ago.   ? ?Review of Systems  ?Positive: Chill, vomiting, nausea and abd pain. ?Negative: No cough,  ? ?Physical Exam  ?BP 129/86 (BP Location: Left Arm)   Pulse 70   Temp 98.7 ?F (37.1 ?C) (Oral)   Resp 18   Ht 5\' 3"  (1.6 m)   Wt 50 kg   LMP 02/28/2021 (Approximate)   SpO2 98%   BMI 19.53 kg/m?  ?Gen:   Awake, no distress   ?Resp:  Normal effort Clear bilat.   ?MSK:   Moves extremities without difficulty, walking without assistance.  ?Other:  ABD, soft flat, BS hypo.  Diffuse tenderness.  ? ?Medical Decision Making  ?Medically screening exam initiated at 8:03 AM.  Appropriate orders placed.  Chelsea Reese was informed that the remainder of the evaluation will be completed by another provider, this initial triage assessment does not replace that evaluation, and the importance of remaining in the ED until their evaluation is complete. ? ? ?  ?Johnn Hai, PA-C ?03/16/21 1308 ? ?

## 2021-03-16 NOTE — ED Provider Notes (Signed)
? ?[facility] ?Provider Note ? ? ? Event Date/Time  ? First MD Initiated Contact with Patient 03/16/21 [phone]   ?  (approximate) ? ? ?History  ? ?Abdominal Pain ? ? ?HPI ? ?Chelsea Reese is a 26 y.o. female with past medical history of THC use and previous gastric ulcer who presents for evaluation of 4 days of some intermittent crampy abdominal pain.  She describes this primarily around the umbilicus but sometimes radiating towards left upper quadrant, right upper quadrant and left lower quadrant.  She denies any urinary symptoms but endorses some nausea and vomiting today.  Also endorses some constipation and states last bowel movement was 3 days ago.  Has not taken any laxatives and is not currently on any antacids.  Denies any significant NSAID use or recent EtOH use.  No cough, fevers, headache, earache, sore throat, chest pain rash or extremity pain.  No other acute concerns at this time.  Patient did come to the emergency room yesterday evening but left prior to being seen due to long wait times.  No analgesia prior to arrival. ? ?  ? ? ?Physical Exam  ?Triage Vital Signs: ?ED Triage Vitals [03/16/21 0801]  ?Enc Vitals Group  ?   BP 129/86  ?   Pulse Rate 70  ?   Resp 18  ?   Temp 98.7 ?F (37.1 ?C)  ?   Temp Source Oral  ?   SpO2 98 %  ?   Weight 110 lb 3.7 oz (50 kg)  ?   Height 5\' 3"  (1.6 m)  ?   Head Circumference   ?   Peak Flow   ?   Pain Score 8  ?   Pain Loc   ?   Pain Edu?   ?   Excl. in GC?   ? ? ?Most recent vital signs: ?Vitals:  ? 03/16/21 0900 03/16/21 1000  ?BP:  102/64  ?Pulse: 81 63  ?Resp:    ?Temp:    ?SpO2: 98% 98%  ? ? ?General: Awake, no distress.  ?CV:  Good peripheral perfusion.  2+ radial pulse. ?Resp:  Normal effort.  ?Abd:  No distention.  Mildly tender throughout without being clearly localized on exam.  No rigidity or guarding. ?Other:  No significant CVA tenderness. ? ? ?ED Results / Procedures / Treatments  ?Labs ?(all labs ordered are listed, but only  abnormal results are displayed) ?Labs Reviewed - No data to display ? ? ?EKG ? ? ?RADIOLOGY ? ?CT abdomen pelvis on my interpretation without evidence of appendicitis, diverticulitis, kidney stone, perinephric stranding or other clear acute process.  Also reviewed radiology interpretation and agree with their findings of possible gastritis versus peristalsis and some likely physiologic free fluid and left ovarian involuting follicle. ? ?Pelvic ultrasound my interpretation with unremarkable uterus and normal ovaries with no evidence of torsion.  Also reviewed radiology interpretation and agree with the findings of same. ? ? ?PROCEDURES: ? ?Critical Care performed: No ? ?Procedures ? ? ? ?MEDICATIONS ORDERED IN ED: ?Medications  ?metoCLOPramide (REGLAN) injection 10 mg (10 mg Intravenous Given 03/16/21 0849)  ?pantoprazole (PROTONIX) injection 40 mg (40 mg Intravenous Given 03/16/21 0938)  ?morphine (PF) 4 MG/ML injection 4 mg (4 mg Intravenous Given 03/16/21 0851)  ?iohexol (OMNIPAQUE) 300 MG/ML solution 80 mL (80 mLs Intravenous Contrast Given 03/16/21 0922)  ? ? ? ?IMPRESSION / MDM / ASSESSMENT AND PLAN / ED COURSE  ?I reviewed the triage vital signs and  the nursing notes. ?             ?               ? ?Differential diagnosis includes, but is not limited to peptic ulcer disease, diverticulitis, appendicitis, ovarian cyst, cholecystitis, pancreatitis, metabolic derangements and symptomatic constipation. ? ?I was able to review labs and urine from yesterday evening.  CMP without any significant electrolyte or metabolic derangements.  Lipase not suggestive of acute pancreatitis.  CBC shows WBC count of 13.3 without evidence of acute anemia and normal platelets.  Pregnancy test was negative.  UA with trace leukocyte esterase and rare bacteria but no other evidence of clinically significant cystitis. ? ?CT abdomen pelvis on my interpretation without evidence of appendicitis, diverticulitis, kidney stone, perinephric stranding  or other clear acute process.  Also reviewed radiology interpretation and agree with their findings of possible gastritis versus peristalsis and some likely physiologic free fluid and left ovarian involuting follicle. ? ?Pelvic ultrasound my interpretation with unremarkable uterus and normal ovaries with no evidence of torsion.  Also reviewed radiology interpretation and agree with the findings of same. ? ?I suspect a component of gastritis possibly with exacerbation of underlying ulcer disease and some symptomatic constipation.  I have a low suspicion for other immediate life-threatening process given very reassuring CT and ultrasound patient feeling much better after some Protonix and Reglan.  Will write for short course of Reglan and to stimulate her motility and course of Protonix and Carafate.  Discussed using a bowel regimen and close outpatient PCP follow-up.  Discharged in stable condition.  Strict return precautions advised and discussed.  Specifically discussed returning for any new or worsening of symptoms. ? ? ?  ? ? ?FINAL CLINICAL IMPRESSION(S) / ED DIAGNOSES  ? ?Final diagnoses:  ?Generalized abdominal pain  ? ? ? ?Rx / DC Orders  ? ?ED Discharge Orders   ? ?      Ordered  ?  pantoprazole (PROTONIX) 40 MG tablet  Daily       ? 03/16/21 1130  ?  metoCLOPramide (REGLAN) 10 MG tablet  3 times daily with meals       ? 03/16/21 1130  ?  sucralfate (CARAFATE) 1 g tablet  4 times daily       ? 03/16/21 1131  ? ?  ?  ? ?  ? ? ? ?Note:  This document was prepared using Dragon voice recognition software and may include unintentional dictation errors. ?  ?Gilles Chiquito, MD ?03/16/21 1132 ? ?

## 2021-03-16 NOTE — ED Notes (Signed)
No answer when called, pt not visualized in lobby 

## 2021-03-16 NOTE — Discharge Instructions (Addendum)
Your CT today showed: ?FINDINGS: ?Lower chest: No acute abnormality. ?  ?Hepatobiliary: No focal liver abnormality is seen. No gallstones, ?gallbladder wall thickening, or biliary dilatation. ?  ?Pancreas: Unremarkable. No pancreatic ductal dilatation or ?surrounding inflammatory changes. ?  ?Spleen: Normal in size without focal abnormality. ?  ?Adrenals/Urinary Tract: Adrenal glands are unremarkable. Kidneys are ?normal, without renal calculi, focal lesion, or hydronephrosis. ?Bladder is unremarkable. ?  ?Stomach/Bowel: Partially fluid-filled stomach. Relative thickening ?of the gastric wall involving the antrum likely secondary to ?peristalsis versus less likely gastritis. No evidence of bowel wall ?thickening, distention, or inflammatory changes. Appendix is normal. ?  ?Vascular/Lymphatic: No significant vascular findings are present. No ?enlarged abdominal or pelvic lymph nodes. ?  ?Reproductive: Normal uterus. No adnexal mass. Left ovarian ?involuting follicle. ?  ?Other: No abdominal wall hernia. Small amount of pelvic free fluid ?likely physiologic. ?  ?Musculoskeletal: No acute osseous abnormality. No aggressive osseous ?lesion. ?  ?IMPRESSION: ?1. Relative thickening of the gastric wall involving the antrum ?likely secondary to peristalsis versus less likely gastritis. ?2. Otherwise, no acute abdominal or pelvic pathology. ? ?Your [place] today showed: ?IMPRESSION: ?1.  Normal uterus. ?  ?2. Bilateral ovaries are normal in size with normal blood flow ?without evidence of torsion. ?

## 2022-09-20 IMAGING — US US PELVIS COMPLETE
1 series · 14 of 25 positions shown · non-contrast
Comparison: None.

CLINICAL DATA: Pelvic pain for 4 days.

EXAM:
TRANSABDOMINAL AND TRANSVAGINAL ULTRASOUND OF PELVIS
DOPPLER ULTRASOUND OF OVARIES
TECHNIQUE: Only transabdominal ultrasound examinations of the pelvis was
performed, patient refused transvaginal examination. Transabdominal
technique was performed for global imaging of the pelvis including
uterus, ovaries, adnexal regions, and pelvic cul-de-sac.

[Series 1: us pelvic complete w transvaginal and torsion righ · 14 of 87 slices shown]
[im 1/87]
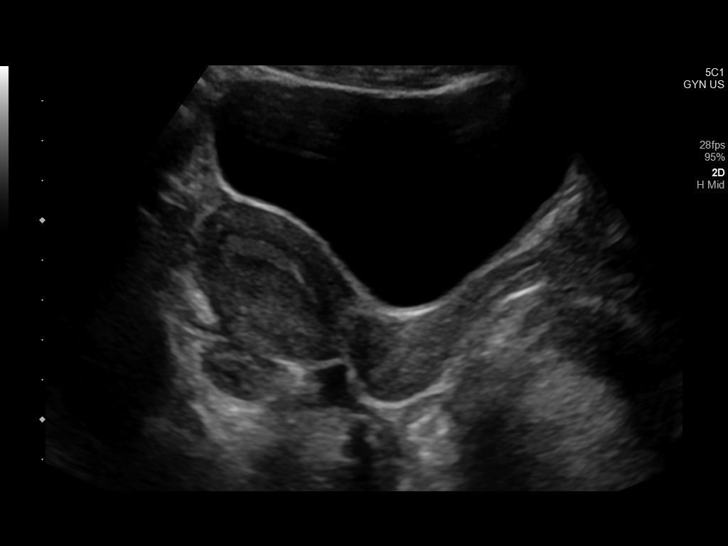
[im 8/87]
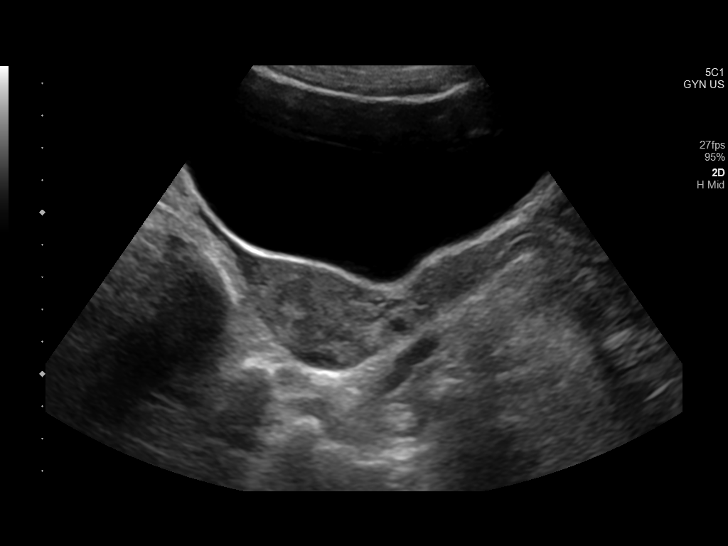
[im 15/87]
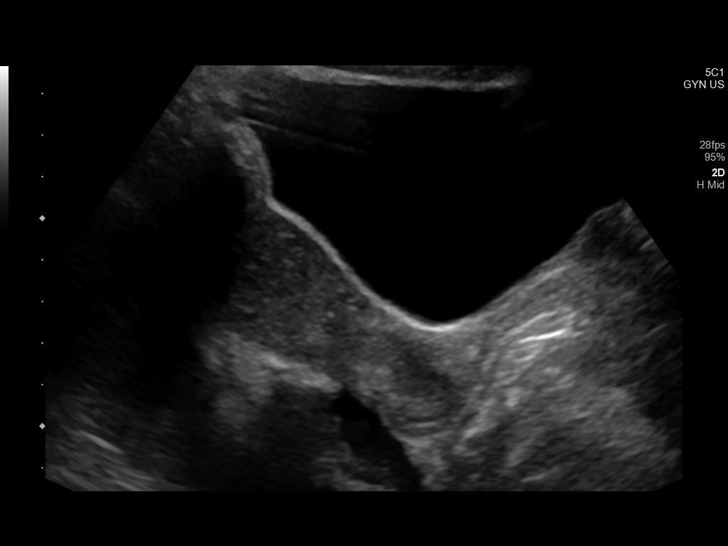
[im 22/87]
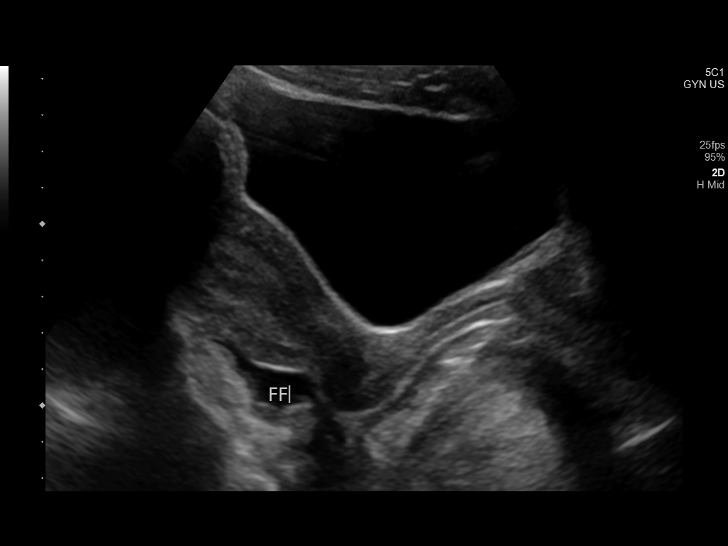
[im 29/87]
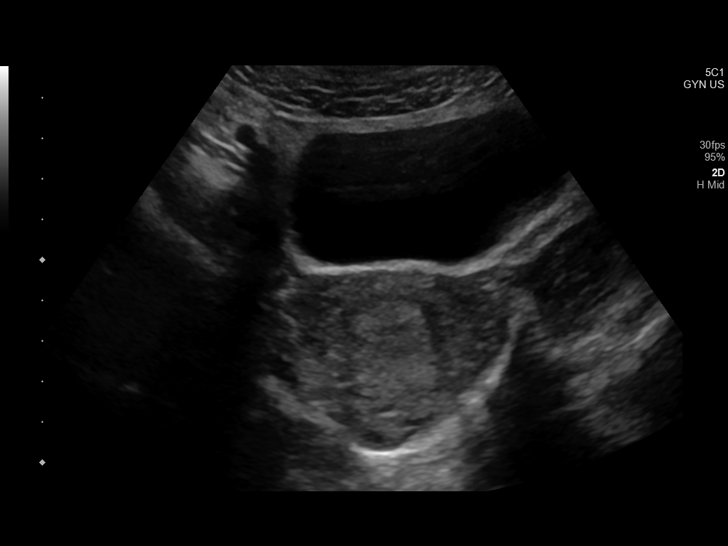
[im 33/87]
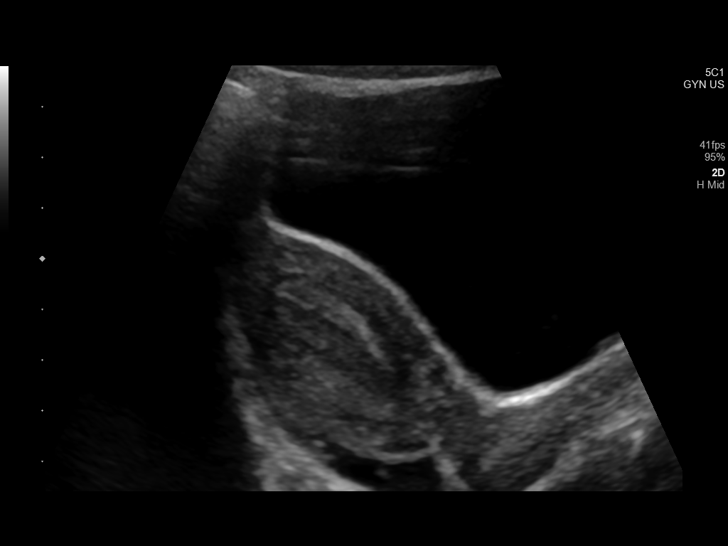
[im 40/87]
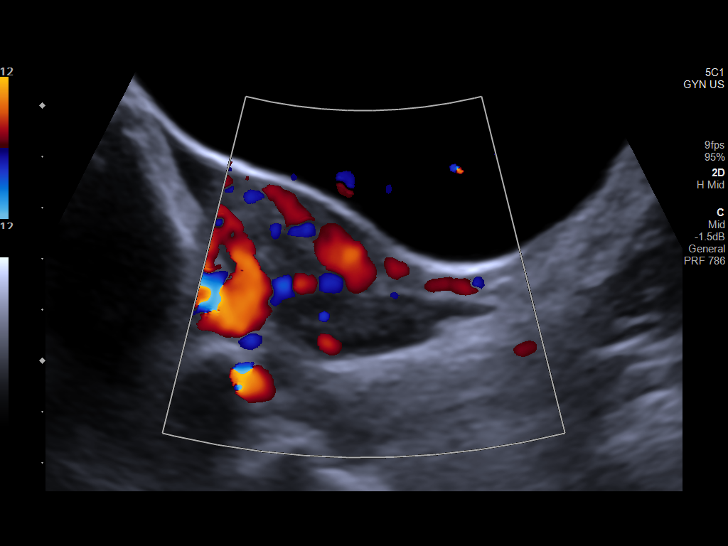
[im 47/87]
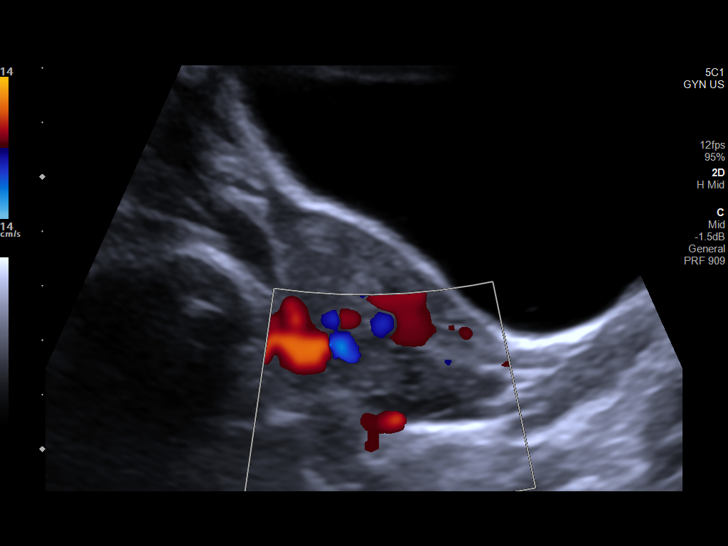
[im 54/87]
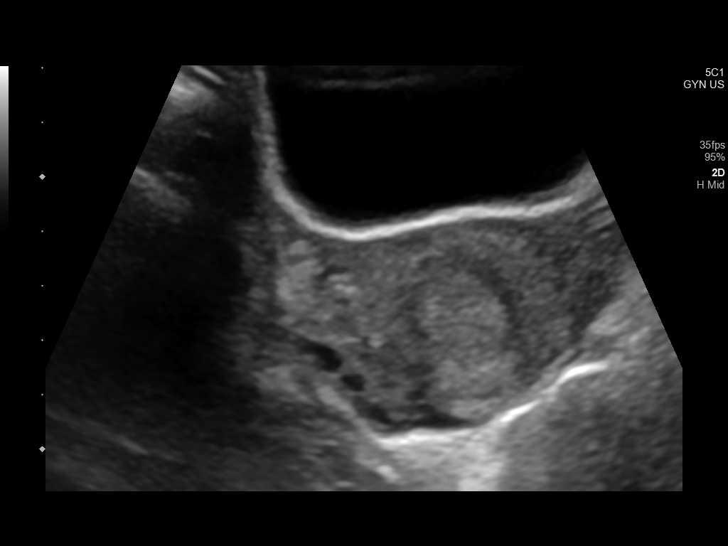
[im 58/87]
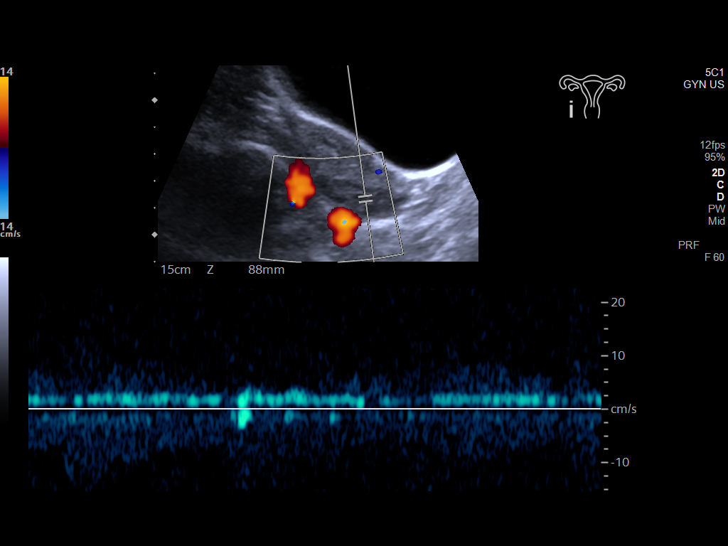
[im 65/87]
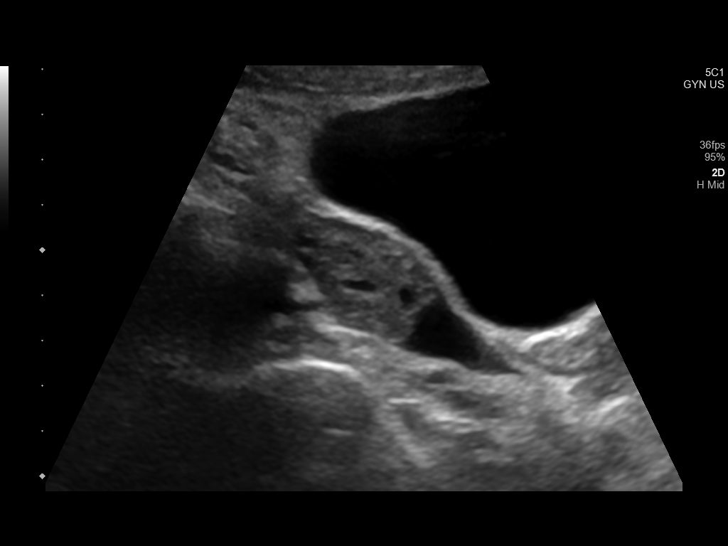
[im 72/87]
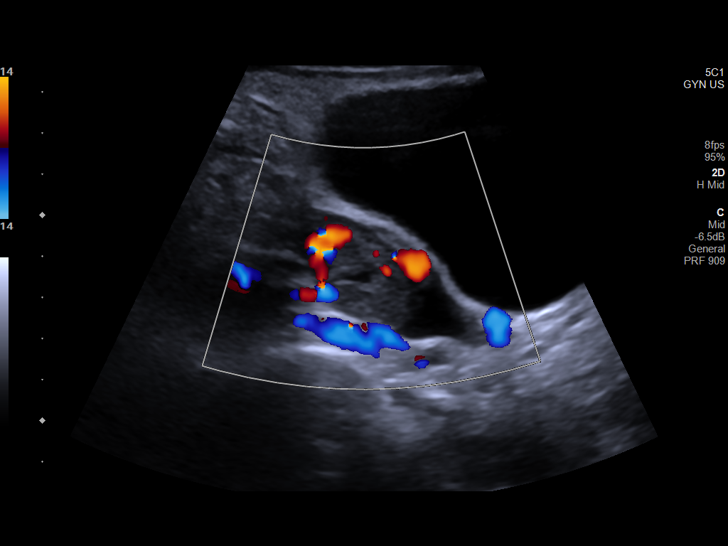
[im 79/87]
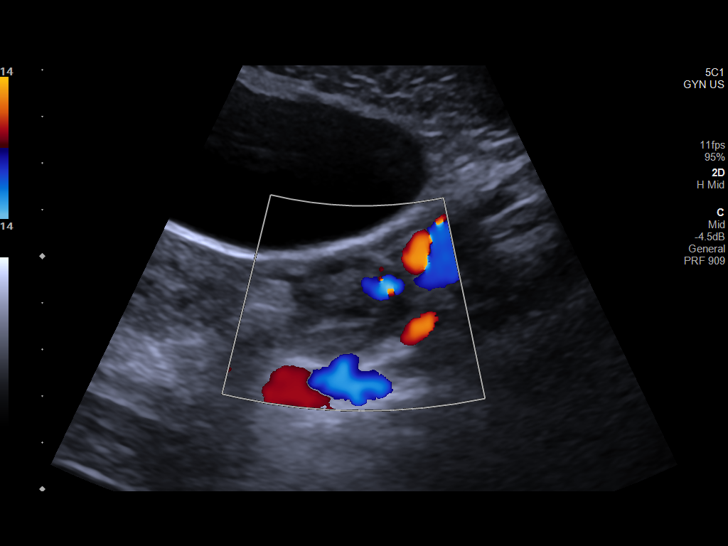
[im 87/87]
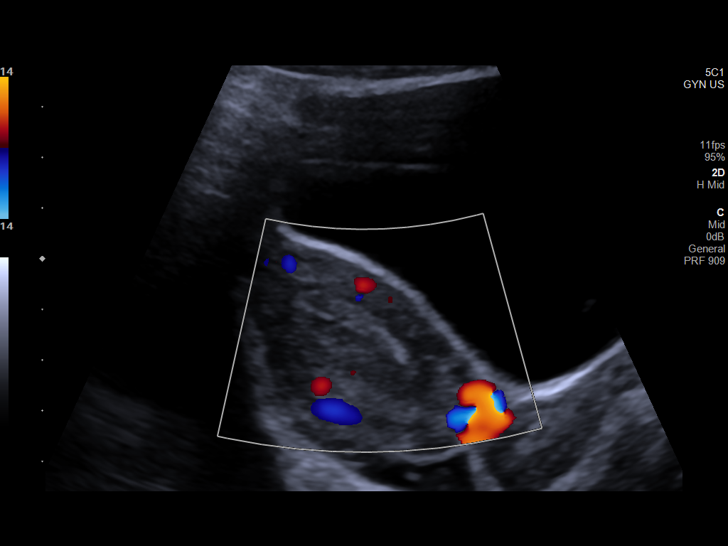

[14 of 25 positions shown; findings below may reference images not displayed]

FINDINGS: Uterus

Measurements: 7.0 x 3.0 x 5.1 = volume: 54.8 mL. No fibroids or
other mass visualized.

Endometrium

Thickness: 12 mm.  No focal abnormality visualized.

Right ovary

Measurements: 2.6 x 1.5 x 2.3 = volume: 4.4 mL. Normal appearance/no
adnexal mass.

Left ovary

Measurements: 3.9 x 2.1 x 3.0 = volume: 12.7 mL. Normal
appearance/no adnexal mass.

Pulsed Doppler evaluation of both ovaries demonstrates normal
low-resistance arterial and venous waveforms.

Other findings

No abnormal free fluid.
IMPRESSION: 1.  Normal uterus.

2. Bilateral ovaries are normal in size with normal blood flow
without evidence of torsion.
